# Patient Record
Sex: Male | Born: 1935 | Race: White | Hispanic: No | Marital: Married | Smoking: Former smoker
Health system: Southern US, Community
[De-identification: ages and names within clinical notes are randomized; demographics above are authoritative.]

## PROBLEM LIST (undated history)

## (undated) DIAGNOSIS — I6529 Occlusion and stenosis of unspecified carotid artery: Secondary | ICD-10-CM

## (undated) DIAGNOSIS — Z950 Presence of cardiac pacemaker: Secondary | ICD-10-CM

## (undated) DIAGNOSIS — Z8551 Personal history of malignant neoplasm of bladder: Secondary | ICD-10-CM

## (undated) DIAGNOSIS — K311 Adult hypertrophic pyloric stenosis: Secondary | ICD-10-CM

## (undated) DIAGNOSIS — C801 Malignant (primary) neoplasm, unspecified: Secondary | ICD-10-CM

## (undated) DIAGNOSIS — I453 Trifascicular block: Secondary | ICD-10-CM

## (undated) DIAGNOSIS — N400 Enlarged prostate without lower urinary tract symptoms: Secondary | ICD-10-CM

## (undated) DIAGNOSIS — E039 Hypothyroidism, unspecified: Secondary | ICD-10-CM

## (undated) DIAGNOSIS — E785 Hyperlipidemia, unspecified: Secondary | ICD-10-CM

## (undated) HISTORY — DX: Personal history of malignant neoplasm of bladder: Z85.51

## (undated) HISTORY — DX: Hyperlipidemia, unspecified: E78.5

## (undated) HISTORY — DX: Malignant (primary) neoplasm, unspecified: C80.1

## (undated) HISTORY — DX: Presence of cardiac pacemaker: Z95.0

## (undated) HISTORY — DX: Trifascicular block: I45.3

## (undated) HISTORY — DX: Hypothyroidism, unspecified: E03.9

## (undated) HISTORY — DX: Occlusion and stenosis of unspecified carotid artery: I65.29

## (undated) HISTORY — PX: HEMORROIDECTOMY: SUR656

## (undated) HISTORY — PX: ORCHIECTOMY: SHX2116

## (undated) HISTORY — DX: Adult hypertrophic pyloric stenosis: K31.1

## (undated) HISTORY — DX: Benign prostatic hyperplasia without lower urinary tract symptoms: N40.0

---

## 1997-11-11 ENCOUNTER — Ambulatory Visit (HOSPITAL_COMMUNITY): Admission: RE | Admit: 1997-11-11 | Discharge: 1997-11-11 | Payer: Self-pay | Admitting: Internal Medicine

## 2003-05-24 ENCOUNTER — Ambulatory Visit (HOSPITAL_COMMUNITY): Admission: RE | Admit: 2003-05-24 | Discharge: 2003-05-24 | Payer: Self-pay | Admitting: *Deleted

## 2004-03-03 ENCOUNTER — Ambulatory Visit: Payer: Self-pay | Admitting: Internal Medicine

## 2004-08-08 ENCOUNTER — Ambulatory Visit: Payer: Self-pay | Admitting: Internal Medicine

## 2005-03-05 ENCOUNTER — Ambulatory Visit: Payer: Self-pay | Admitting: Internal Medicine

## 2005-09-04 ENCOUNTER — Ambulatory Visit: Payer: Self-pay | Admitting: Internal Medicine

## 2006-03-05 ENCOUNTER — Ambulatory Visit: Payer: Self-pay | Admitting: Internal Medicine

## 2006-04-29 ENCOUNTER — Ambulatory Visit: Payer: Self-pay | Admitting: Vascular Surgery

## 2006-04-29 ENCOUNTER — Encounter: Payer: Self-pay | Admitting: Internal Medicine

## 2006-06-24 ENCOUNTER — Ambulatory Visit: Payer: Self-pay | Admitting: Internal Medicine

## 2006-06-26 ENCOUNTER — Ambulatory Visit: Payer: Self-pay | Admitting: Internal Medicine

## 2006-07-01 ENCOUNTER — Encounter: Payer: Self-pay | Admitting: Internal Medicine

## 2006-07-01 DIAGNOSIS — I453 Trifascicular block: Secondary | ICD-10-CM | POA: Insufficient documentation

## 2006-07-01 DIAGNOSIS — E785 Hyperlipidemia, unspecified: Secondary | ICD-10-CM

## 2006-07-01 DIAGNOSIS — E039 Hypothyroidism, unspecified: Secondary | ICD-10-CM

## 2006-07-01 DIAGNOSIS — N400 Enlarged prostate without lower urinary tract symptoms: Secondary | ICD-10-CM | POA: Insufficient documentation

## 2006-07-01 DIAGNOSIS — Z8546 Personal history of malignant neoplasm of prostate: Secondary | ICD-10-CM

## 2006-07-01 DIAGNOSIS — C679 Malignant neoplasm of bladder, unspecified: Secondary | ICD-10-CM | POA: Insufficient documentation

## 2006-07-19 ENCOUNTER — Ambulatory Visit: Payer: Self-pay | Admitting: Internal Medicine

## 2006-07-29 ENCOUNTER — Ambulatory Visit: Payer: Self-pay | Admitting: Internal Medicine

## 2006-07-29 DIAGNOSIS — I749 Embolism and thrombosis of unspecified artery: Secondary | ICD-10-CM | POA: Insufficient documentation

## 2006-08-05 ENCOUNTER — Telehealth: Payer: Self-pay | Admitting: Internal Medicine

## 2006-08-07 ENCOUNTER — Ambulatory Visit: Payer: Self-pay

## 2006-08-07 ENCOUNTER — Ambulatory Visit: Payer: Self-pay | Admitting: Internal Medicine

## 2006-09-10 ENCOUNTER — Ambulatory Visit: Payer: Self-pay | Admitting: Internal Medicine

## 2007-02-21 ENCOUNTER — Ambulatory Visit: Payer: Self-pay | Admitting: Internal Medicine

## 2007-02-21 DIAGNOSIS — Z8669 Personal history of other diseases of the nervous system and sense organs: Secondary | ICD-10-CM

## 2007-02-24 ENCOUNTER — Ambulatory Visit: Payer: Self-pay | Admitting: Cardiology

## 2007-02-24 ENCOUNTER — Encounter: Payer: Self-pay | Admitting: Internal Medicine

## 2007-02-24 ENCOUNTER — Ambulatory Visit: Payer: Self-pay

## 2007-02-24 ENCOUNTER — Ambulatory Visit: Payer: Self-pay | Admitting: Internal Medicine

## 2007-02-24 LAB — CONVERTED CEMR LAB
Basophils Absolute: 0 10*3/uL (ref 0.0–0.1)
Creatinine, Ser: 1.3 mg/dL (ref 0.4–1.5)
Eosinophils Absolute: 0.2 10*3/uL (ref 0.0–0.6)
GFR calc non Af Amer: 56 mL/min
Glucose, Bld: 100 mg/dL — ABNORMAL HIGH (ref 70–99)
HCT: 44.6 % (ref 39.0–52.0)
Hemoglobin: 14.9 g/dL (ref 13.0–17.0)
MCHC: 33.4 g/dL (ref 30.0–36.0)
MCV: 92.6 fL (ref 78.0–100.0)
Monocytes Absolute: 0.3 10*3/uL (ref 0.2–0.7)
Neutrophils Relative %: 66.6 % (ref 43.0–77.0)
Potassium: 5 meq/L (ref 3.5–5.1)
Sodium: 140 meq/L (ref 135–145)
aPTT: 31.3 s — ABNORMAL HIGH (ref 21.7–29.8)

## 2007-02-27 ENCOUNTER — Encounter: Payer: Self-pay | Admitting: Internal Medicine

## 2007-02-27 ENCOUNTER — Ambulatory Visit: Payer: Self-pay

## 2007-03-02 HISTORY — PX: PACEMAKER INSERTION: SHX728

## 2007-03-04 ENCOUNTER — Ambulatory Visit: Payer: Self-pay | Admitting: Internal Medicine

## 2007-03-04 ENCOUNTER — Ambulatory Visit (HOSPITAL_COMMUNITY): Admission: RE | Admit: 2007-03-04 | Discharge: 2007-03-05 | Payer: Self-pay | Admitting: Internal Medicine

## 2007-03-17 ENCOUNTER — Ambulatory Visit: Payer: Self-pay

## 2007-03-31 ENCOUNTER — Ambulatory Visit: Payer: Self-pay | Admitting: Cardiology

## 2007-04-18 ENCOUNTER — Ambulatory Visit: Payer: Self-pay | Admitting: Vascular Surgery

## 2007-04-18 ENCOUNTER — Encounter: Payer: Self-pay | Admitting: Internal Medicine

## 2007-06-02 ENCOUNTER — Telehealth: Payer: Self-pay | Admitting: Internal Medicine

## 2007-06-02 DIAGNOSIS — K311 Adult hypertrophic pyloric stenosis: Secondary | ICD-10-CM

## 2007-06-02 HISTORY — DX: Adult hypertrophic pyloric stenosis: K31.1

## 2007-06-02 HISTORY — PX: GASTROJEJUNOSTOMY: SHX1697

## 2007-06-10 ENCOUNTER — Ambulatory Visit: Payer: Self-pay | Admitting: Internal Medicine

## 2007-06-21 ENCOUNTER — Ambulatory Visit: Payer: Self-pay | Admitting: Internal Medicine

## 2007-06-23 ENCOUNTER — Ambulatory Visit: Payer: Self-pay | Admitting: Internal Medicine

## 2007-07-02 ENCOUNTER — Ambulatory Visit: Payer: Self-pay | Admitting: Family Medicine

## 2007-07-03 ENCOUNTER — Ambulatory Visit: Payer: Self-pay | Admitting: Cardiology

## 2007-07-07 ENCOUNTER — Telehealth: Payer: Self-pay | Admitting: Internal Medicine

## 2007-07-07 LAB — CONVERTED CEMR LAB
Alkaline Phosphatase: 56 units/L (ref 39–117)
Basophils Absolute: 0 10*3/uL (ref 0.0–0.1)
Bilirubin, Direct: 0.1 mg/dL (ref 0.0–0.3)
Calcium: 9.4 mg/dL (ref 8.4–10.5)
GFR calc Af Amer: 58 mL/min
GFR calc non Af Amer: 48 mL/min
Glucose, Bld: 113 mg/dL — ABNORMAL HIGH (ref 70–99)
HCT: 48.1 % (ref 39.0–52.0)
Lipase: 43 units/L (ref 11.0–59.0)
MCHC: 33.6 g/dL (ref 30.0–36.0)
Monocytes Absolute: 0.5 10*3/uL (ref 0.1–1.0)
Monocytes Relative: 3.3 % (ref 3.0–12.0)
Platelets: 218 10*3/uL (ref 150–400)
Potassium: 4.4 meq/L (ref 3.5–5.1)
RDW: 12.7 % (ref 11.5–14.6)
Sodium: 142 meq/L (ref 135–145)
TSH: 2.08 microintl units/mL (ref 0.35–5.50)
Total Bilirubin: 0.8 mg/dL (ref 0.3–1.2)
Total Protein: 7.1 g/dL (ref 6.0–8.3)

## 2007-07-08 ENCOUNTER — Ambulatory Visit (HOSPITAL_COMMUNITY): Admission: RE | Admit: 2007-07-08 | Discharge: 2007-07-08 | Payer: Self-pay | Admitting: Family Medicine

## 2007-07-08 ENCOUNTER — Telehealth: Payer: Self-pay | Admitting: Internal Medicine

## 2007-07-08 ENCOUNTER — Ambulatory Visit: Payer: Self-pay | Admitting: Cardiology

## 2007-07-08 ENCOUNTER — Inpatient Hospital Stay (HOSPITAL_COMMUNITY): Admission: EM | Admit: 2007-07-08 | Discharge: 2007-07-29 | Payer: Self-pay | Admitting: Emergency Medicine

## 2007-07-08 ENCOUNTER — Ambulatory Visit: Payer: Self-pay | Admitting: Internal Medicine

## 2007-07-11 ENCOUNTER — Encounter (INDEPENDENT_AMBULATORY_CARE_PROVIDER_SITE_OTHER): Payer: Self-pay | Admitting: *Deleted

## 2007-08-05 ENCOUNTER — Ambulatory Visit: Payer: Self-pay | Admitting: Internal Medicine

## 2007-08-05 DIAGNOSIS — E46 Unspecified protein-calorie malnutrition: Secondary | ICD-10-CM

## 2007-09-02 ENCOUNTER — Ambulatory Visit: Payer: Self-pay | Admitting: Internal Medicine

## 2007-12-18 ENCOUNTER — Ambulatory Visit: Payer: Self-pay | Admitting: Internal Medicine

## 2008-02-27 DIAGNOSIS — I6529 Occlusion and stenosis of unspecified carotid artery: Secondary | ICD-10-CM

## 2008-03-02 ENCOUNTER — Encounter: Payer: Self-pay | Admitting: Internal Medicine

## 2008-03-02 ENCOUNTER — Ambulatory Visit: Payer: Self-pay | Admitting: Internal Medicine

## 2008-03-11 ENCOUNTER — Ambulatory Visit: Payer: Self-pay | Admitting: Internal Medicine

## 2008-03-11 DIAGNOSIS — R42 Dizziness and giddiness: Secondary | ICD-10-CM

## 2008-03-12 LAB — CONVERTED CEMR LAB
ALT: 21 units/L (ref 0–53)
Albumin: 3.4 g/dL — ABNORMAL LOW (ref 3.5–5.2)
Alkaline Phosphatase: 61 units/L (ref 39–117)
BUN: 25 mg/dL — ABNORMAL HIGH (ref 6–23)
Bilirubin, Direct: 0.1 mg/dL (ref 0.0–0.3)
CO2: 31 meq/L (ref 19–32)
Calcium: 8.7 mg/dL (ref 8.4–10.5)
Eosinophils Relative: 2.8 % (ref 0.0–5.0)
GFR calc Af Amer: 74 mL/min
Glucose, Bld: 103 mg/dL — ABNORMAL HIGH (ref 70–99)
HCT: 43.2 % (ref 39.0–52.0)
Hemoglobin: 14.8 g/dL (ref 13.0–17.0)
Lymphocytes Relative: 33 % (ref 12.0–46.0)
Monocytes Absolute: 0.7 10*3/uL (ref 0.1–1.0)
Monocytes Relative: 9.8 % (ref 3.0–12.0)
Neutro Abs: 3.6 10*3/uL (ref 1.4–7.7)
RBC: 4.51 M/uL (ref 4.22–5.81)
Sodium: 145 meq/L (ref 135–145)
TSH: 10.44 microintl units/mL — ABNORMAL HIGH (ref 0.35–5.50)
Total Protein: 6.5 g/dL (ref 6.0–8.3)
WBC: 6.7 10*3/uL (ref 4.5–10.5)

## 2008-04-06 ENCOUNTER — Encounter: Payer: Self-pay | Admitting: Internal Medicine

## 2008-04-06 ENCOUNTER — Ambulatory Visit: Payer: Self-pay | Admitting: Internal Medicine

## 2008-04-16 ENCOUNTER — Encounter (INDEPENDENT_AMBULATORY_CARE_PROVIDER_SITE_OTHER): Payer: Self-pay

## 2008-04-23 ENCOUNTER — Ambulatory Visit: Payer: Self-pay | Admitting: Vascular Surgery

## 2008-05-06 ENCOUNTER — Ambulatory Visit: Payer: Self-pay | Admitting: Internal Medicine

## 2008-05-07 LAB — CONVERTED CEMR LAB: TSH: 10.6 microintl units/mL — ABNORMAL HIGH (ref 0.35–5.50)

## 2008-06-23 ENCOUNTER — Ambulatory Visit: Payer: Self-pay | Admitting: Internal Medicine

## 2008-06-28 ENCOUNTER — Encounter: Payer: Self-pay | Admitting: Internal Medicine

## 2008-07-01 HISTORY — PX: ESOPHAGOGASTRODUODENOSCOPY: SHX1529

## 2008-07-15 ENCOUNTER — Encounter: Payer: Self-pay | Admitting: Internal Medicine

## 2008-07-15 ENCOUNTER — Ambulatory Visit: Payer: Self-pay | Admitting: Internal Medicine

## 2008-07-16 ENCOUNTER — Ambulatory Visit (HOSPITAL_COMMUNITY): Admission: RE | Admit: 2008-07-16 | Discharge: 2008-07-16 | Payer: Self-pay | Admitting: *Deleted

## 2008-07-16 ENCOUNTER — Encounter (INDEPENDENT_AMBULATORY_CARE_PROVIDER_SITE_OTHER): Payer: Self-pay | Admitting: *Deleted

## 2008-07-16 ENCOUNTER — Encounter: Payer: Self-pay | Admitting: Internal Medicine

## 2008-07-27 ENCOUNTER — Encounter: Payer: Self-pay | Admitting: Internal Medicine

## 2008-10-14 ENCOUNTER — Ambulatory Visit: Payer: Self-pay | Admitting: Internal Medicine

## 2008-10-19 ENCOUNTER — Ambulatory Visit: Payer: Self-pay | Admitting: Internal Medicine

## 2009-01-13 ENCOUNTER — Ambulatory Visit: Payer: Self-pay | Admitting: Internal Medicine

## 2009-03-24 ENCOUNTER — Ambulatory Visit: Payer: Self-pay | Admitting: Internal Medicine

## 2009-04-07 ENCOUNTER — Ambulatory Visit: Payer: Self-pay | Admitting: Vascular Surgery

## 2009-04-14 ENCOUNTER — Ambulatory Visit: Payer: Self-pay | Admitting: Internal Medicine

## 2009-07-14 ENCOUNTER — Ambulatory Visit: Payer: Self-pay | Admitting: Internal Medicine

## 2009-10-13 ENCOUNTER — Ambulatory Visit: Payer: Self-pay | Admitting: Internal Medicine

## 2009-10-25 ENCOUNTER — Ambulatory Visit: Payer: Self-pay | Admitting: Internal Medicine

## 2009-11-04 IMAGING — US US ABDOMEN COMPLETE
2 series · 14 of 25 positions shown · non-contrast
Comparison: CT abdomen 07/03/2007, abdomen ultrasound 06/26/2006

CLINICAL DATA: Abdominal pain, bilateral renal lesions.

ABDOMEN ULTRASOUND
TECHNIQUE: Complete abdominal ultrasound examination was performed
including evaluation of the liver, gallbladder, bile ducts,
pancreas, kidneys, spleen, IVC, and abdominal aorta.

[Series 1: unknown · 0.28mm/px · 12 of 67 slices shown (1 of 2)]
[im 1/67]
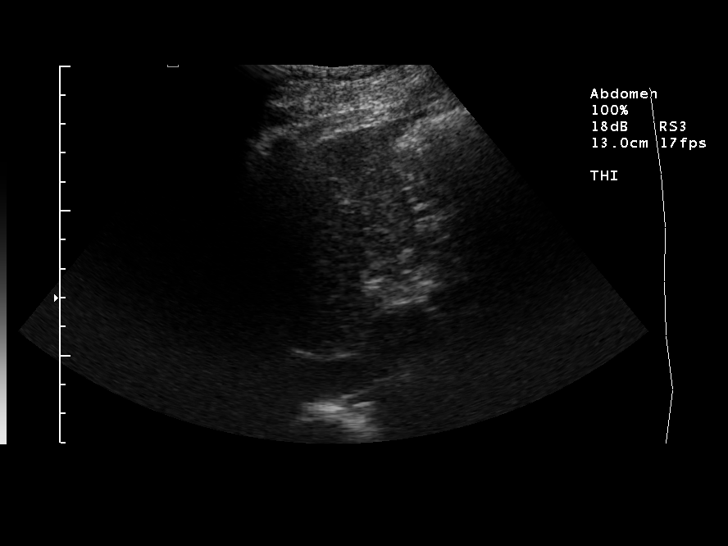
[im 7/67]
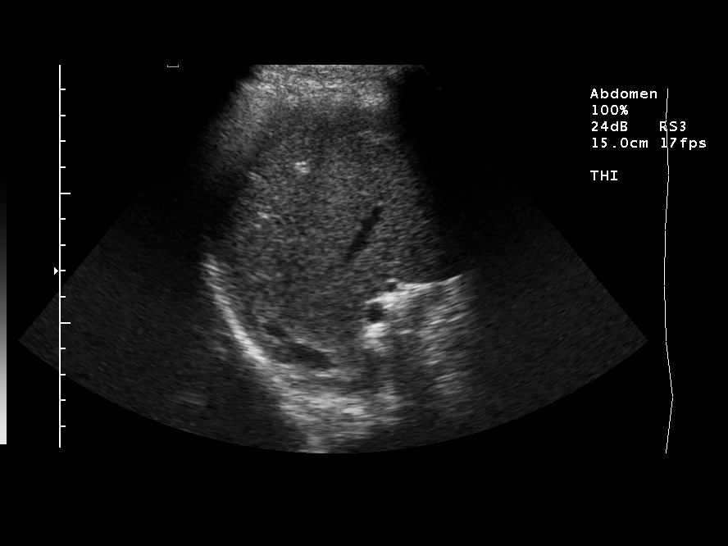
[im 14/67]
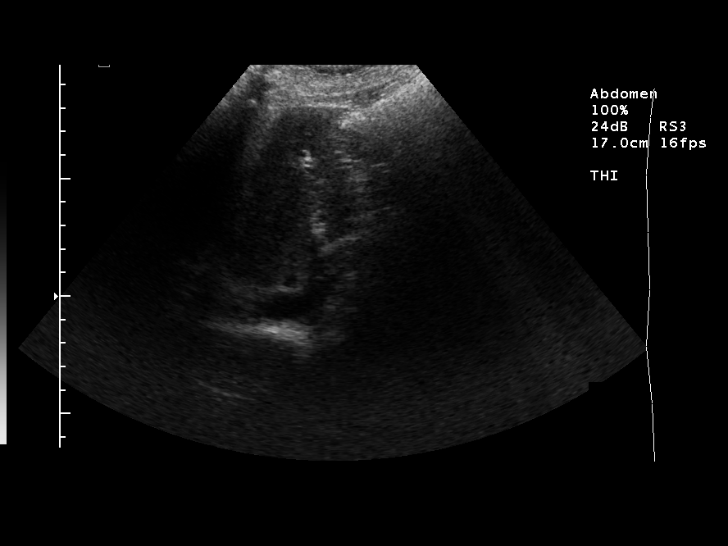
[im 20/67]
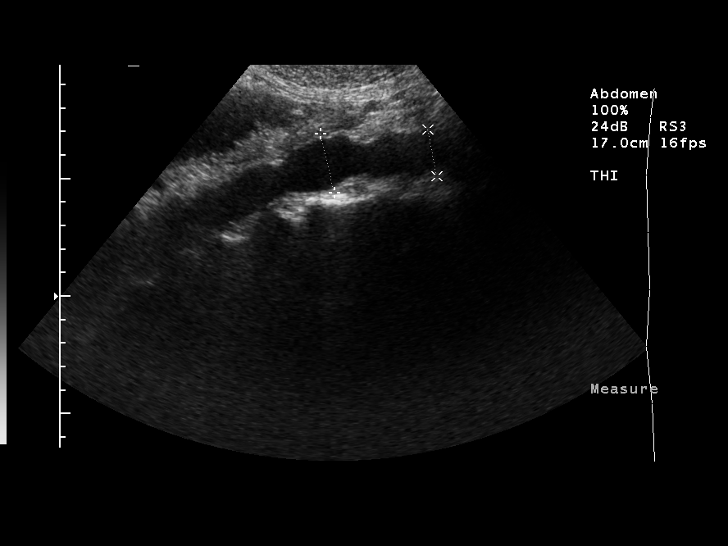
[im 27/67]
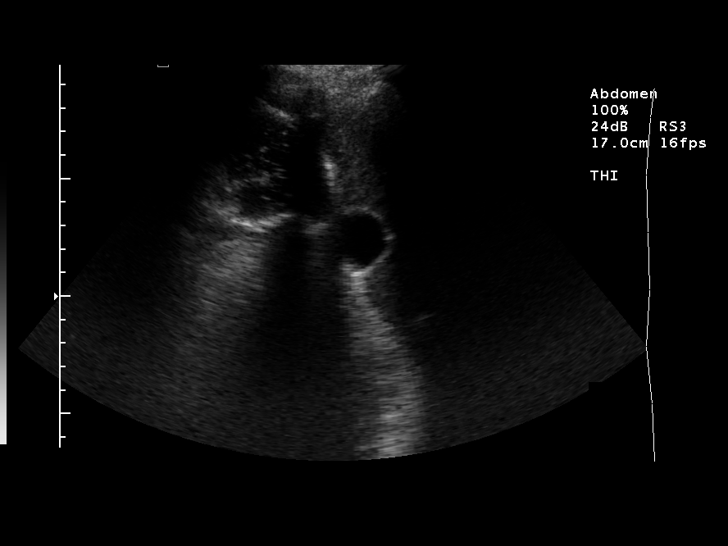
[im 30/67]
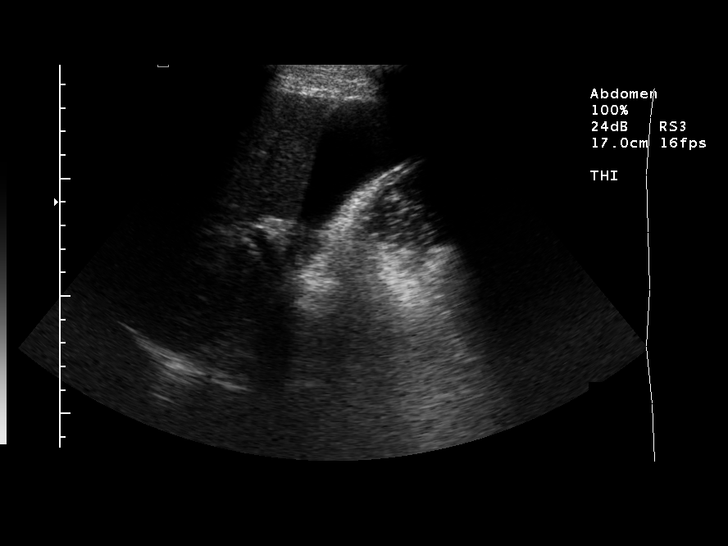
[im 37/67]
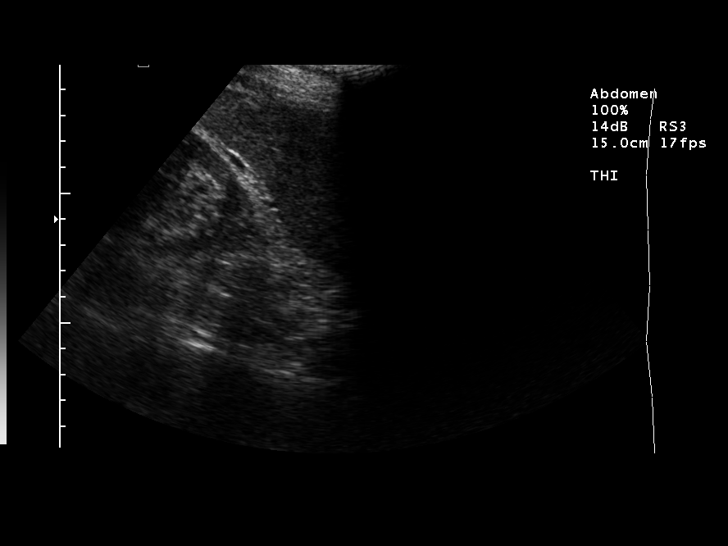
[im 43/67]
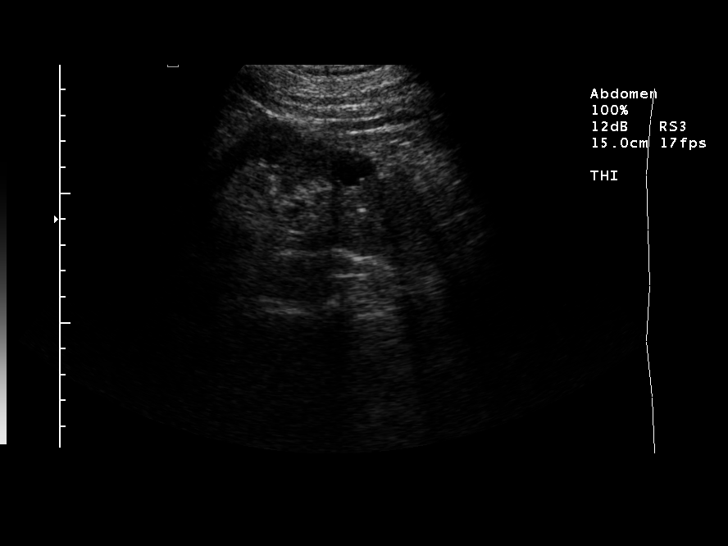
[im 50/67]
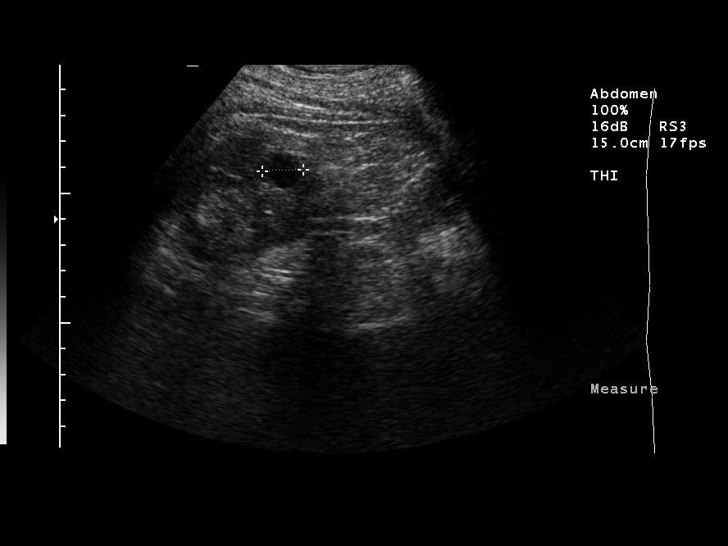
[im 53/67]
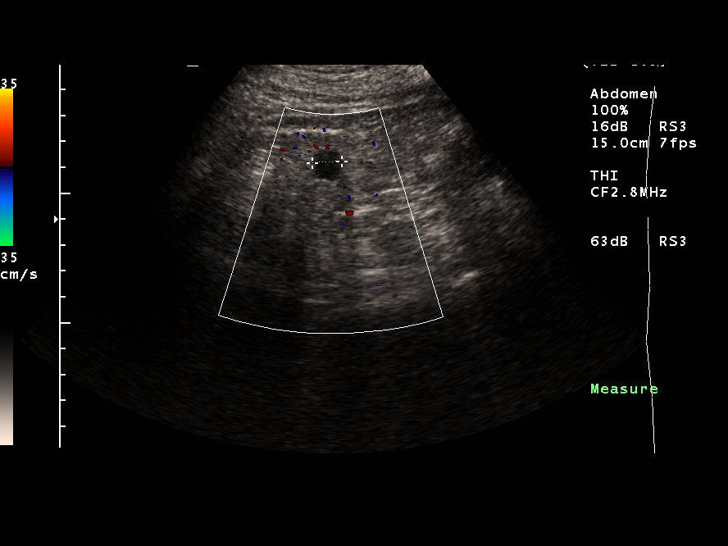
[im 60/67]
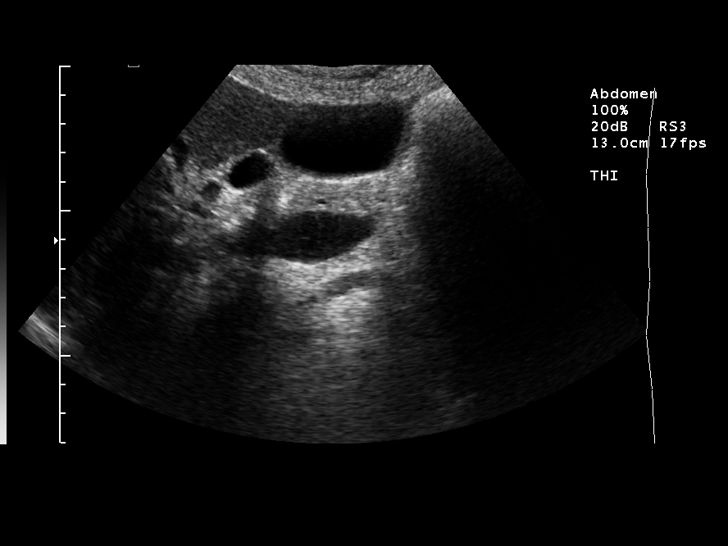
[im 67/67]
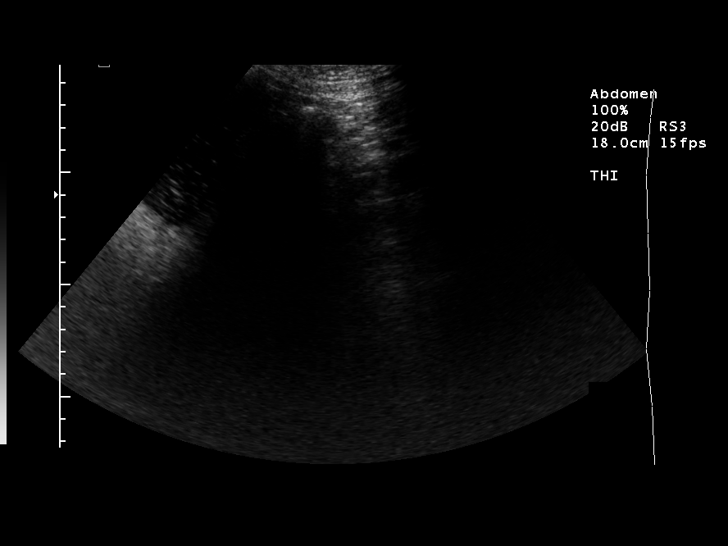

[Series 1: unknown · 0.27mm/px · 2 of 11 slices shown (2 of 2)]
[im 4/11]
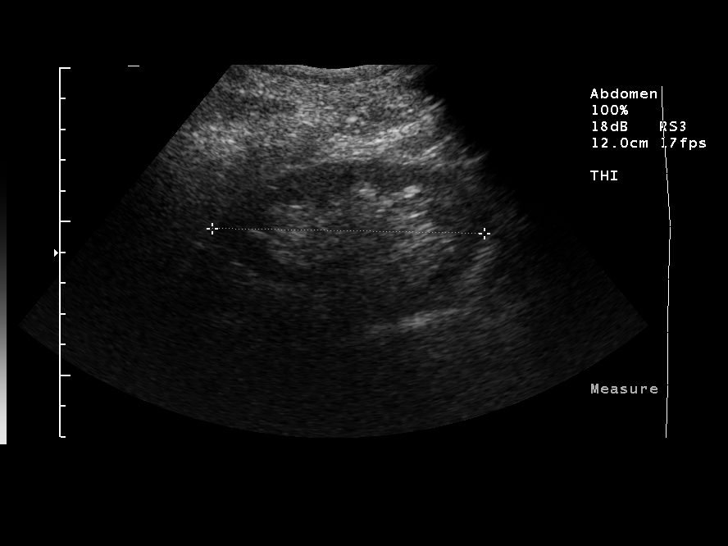
[im 11/11]
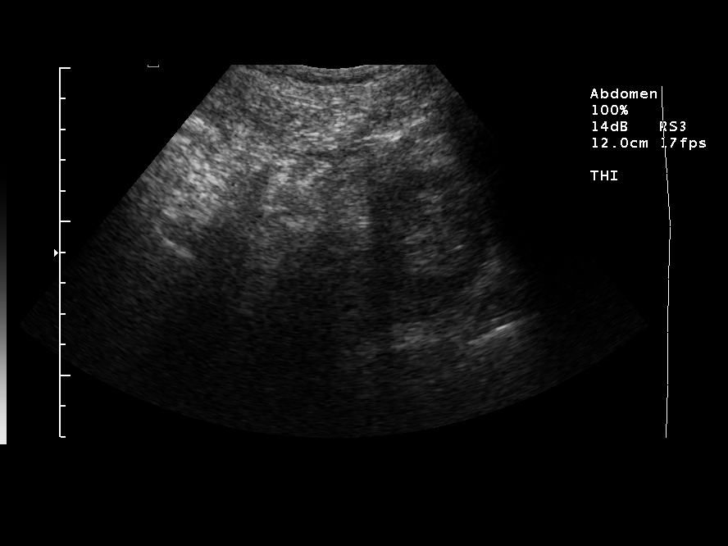

[14 of 25 positions shown; findings below may reference images not displayed]

FINDINGS: Intrahepatic IVC and pancreas and mid/distal abdominal
aorta were obscured by bowel gas.  Liver, gallbladder, common duct,
spleen, right kidney and proximal abdominal aorta are normal in
appearance. Exophytic 1.6 x 1.3 x 1.1 cm simple appearing cyst
noted off the left lower renal pole.  Probable left renal
peripelvic cysts again noted.
IMPRESSION: Simple appearing left lower renal pole cyst corresponds to the
hyperdense cyst seen on the prior CT.

## 2010-01-12 ENCOUNTER — Encounter: Payer: Self-pay | Admitting: Internal Medicine

## 2010-01-24 ENCOUNTER — Ambulatory Visit: Payer: Self-pay | Admitting: Internal Medicine

## 2010-01-29 LAB — CONVERTED CEMR LAB
ALT: 15 units/L (ref 0–53)
ALT: 28 units/L (ref 0–53)
AST: 22 units/L (ref 0–37)
AST: 25 units/L (ref 0–37)
AST: 34 units/L (ref 0–37)
Albumin: 3.7 g/dL (ref 3.5–5.2)
Alkaline Phosphatase: 58 units/L (ref 39–117)
Alkaline Phosphatase: 62 units/L (ref 39–117)
BUN: 22 mg/dL (ref 6–23)
BUN: 26 mg/dL — ABNORMAL HIGH (ref 6–23)
Basophils Absolute: 0.1 10*3/uL (ref 0.0–0.1)
Basophils Relative: 0.4 % (ref 0.0–3.0)
Bilirubin, Direct: 0.1 mg/dL (ref 0.0–0.3)
Bilirubin, Direct: 0.1 mg/dL (ref 0.0–0.3)
Calcium: 9.1 mg/dL (ref 8.4–10.5)
Chloride: 104 meq/L (ref 96–112)
Chloride: 106 meq/L (ref 96–112)
Chloride: 107 meq/L (ref 96–112)
Creatinine, Ser: 1.2 mg/dL (ref 0.4–1.5)
Creatinine, Ser: 1.3 mg/dL (ref 0.4–1.5)
Eosinophils Absolute: 0.1 10*3/uL (ref 0.0–0.6)
Eosinophils Relative: 2.2 % (ref 0.0–5.0)
Eosinophils Relative: 4.5 % (ref 0.0–5.0)
GFR calc Af Amer: 139 mL/min
GFR calc non Af Amer: 115 mL/min
Glucose, Bld: 74 mg/dL (ref 70–99)
Glucose, Bld: 91 mg/dL (ref 70–99)
HCT: 45.3 % (ref 39.0–52.0)
HCT: 46.7 % (ref 39.0–52.0)
Hemoglobin: 14.4 g/dL (ref 13.0–17.0)
Hemoglobin: 15.6 g/dL (ref 13.0–17.0)
LDL Cholesterol: 65 mg/dL (ref 0–99)
Lymphocytes Relative: 32.2 % (ref 12.0–46.0)
Lymphocytes Relative: 32.4 % (ref 12.0–46.0)
MCHC: 32.4 g/dL (ref 30.0–36.0)
MCV: 93.6 fL (ref 78.0–100.0)
Monocytes Absolute: 0.7 10*3/uL (ref 0.2–0.7)
Monocytes Relative: 12.9 % — ABNORMAL HIGH (ref 3.0–12.0)
Neutro Abs: 2.5 10*3/uL (ref 1.4–7.7)
Neutro Abs: 3.3 10*3/uL (ref 1.4–7.7)
Neutrophils Relative %: 50 % (ref 43.0–77.0)
Neutrophils Relative %: 53.1 % (ref 43.0–77.0)
Neutrophils Relative %: 59.6 % (ref 43.0–77.0)
PSA: 0.63 ng/mL (ref 0.10–4.00)
Potassium: 4.7 meq/L (ref 3.5–5.1)
RBC: 4.58 M/uL (ref 4.22–5.81)
RBC: 4.86 M/uL (ref 4.22–5.81)
RDW: 13.3 % (ref 11.5–14.6)
Sodium: 144 meq/L (ref 135–145)
Sodium: 144 meq/L (ref 135–145)
Total Bilirubin: 0.6 mg/dL (ref 0.3–1.2)
Total Protein: 7 g/dL (ref 6.0–8.3)
VLDL: 19.2 mg/dL (ref 0.0–40.0)
WBC: 4.8 10*3/uL (ref 4.5–10.5)
WBC: 6 10*3/uL (ref 4.5–10.5)

## 2010-02-02 NOTE — Cardiovascular Report (Signed)
Summary: TTM   TTM   Imported By: Roderic Ovens 04/29/2009 15:03:02  _____________________________________________________________________  External Attachment:    Type:   Image     Comment:   External Document

## 2010-02-02 NOTE — Cardiovascular Report (Signed)
Summary: TTM   TTM   Imported By: Roderic Ovens 02/02/2009 10:19:54  _____________________________________________________________________  External Attachment:    Type:   Image     Comment:   External Document

## 2010-02-02 NOTE — Cardiovascular Report (Signed)
Summary: TTM   TTM   Imported By: Roderic Ovens 10/28/2009 15:17:36  _____________________________________________________________________  External Attachment:    Type:   Image     Comment:   External Document

## 2010-02-02 NOTE — Assessment & Plan Note (Signed)
Summary: guidiant/sf      Allergies Added: NKDA  Visit Type:  PPM-[employer] check  CC:  dizziness.  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  He continues to have some episodes of dizziness. He does not note any specific associations apart from standing, specifically not to do in the morning or after large meals or with micturition.  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 Take 1 Tablet By Mouth Every Morning 2)  Zocor 20 Mg Tabs (Simvastatin) .... Take 1 Tablet By Mouth At Bedtime 3)  Multivitamins   Tabs (Multiple Vitamin) .... Once Daily 4)  Vitamin E 400 Unit  Caps (Vitamin E) .... Once Daily 5)  Synthroid 175 Mcg Tabs (Levothyroxine Sodium) .Marland Kitchen.. 1 Once Daily  Allergies (verified): No Known Drug Allergies  Past History:  Past Medical History: Last updated: 2009/03/25 Bladder CA, hx Hyperlipidemia Hypothyroidism Trifascicular block, hx Carotid artery stenosis- left carotid bruit Benign prostatic hypertrophy history of gastric outlet obstruction.  June 2009  Past Surgical History: Last updated: 25-Mar-2009 s/p permanent transvenous pacemaker placement, 3/09 Hemorrhoidectomy Orchiectomy colonoscopy 07-2008 status post gastrojejunostomy June 2009- EGD 07-2008  Family History: Last updated: 2009/03/25 father died in  his 79s, and throat cancer mother died at 65 lungs cancer  One brother history of oral cancer and died of suicide death one sister-s/p MI, dementia (15)  Social History: Last updated: 02/27/2008 Married Tobacco Use - Former.  Alcohol Use - no  Risk Factors: Alcohol Use: 0 (03/25/09) Exercise: yes (25-Mar-2009)  Risk Factors: Smoking Status: quit (25-Mar-2009)  Vital Signs:  Patient profile:   77 year old male Height:      69 inches Weight:      154 pounds BMI:     22.82 Pulse rate:   60 / minute BP sitting:   140 / 82  (right arm) Cuff size:   regular  Vitals Entered By: Caralee Ates CMA (October 25, 2009 11:32 AM)  Physical Exam  General:  The patient was alert and oriented in no acute distress. HEENT Normal.  Neck veins were flat, carotids were brisk.  Lungs were clear.  Heart sounds were regular without murmurs or gallops.  Abdomen was soft with active bowel sounds. There is no clubbing cyanosis or edema. Skin Warm and dry    PPM Specifications Following MD:  Sherryl Manges, MD     [facility] Vendor:  [facility] [employer]     PPM Model Number:  [id number]     PPM Serial Number:  [id number] PPM DOI:  03/04/2007     PPM Implanting MD:  Sherryl Manges, MD  Lead 1    Location: RA     DOI: 03/04/2007     Model #: [id number]     Serial #: [id number]     Status: active Lead 2    Location: RV     DOI: 03/04/2007  Model #: [id number]     Serial #: [id number]     Status: active   Indications:  SYNCOPE   PPM Follow Up Remote Check?  No Battery Voltage:  good V     Battery Est. Longevity:  > 5 years     Pacer Dependent:  No       PPM Device Measurements Atrium  Amplitude: 2.8 mV, Impedance: 540 ohms, Threshold: 0.5 V at 0.4 msec Right Ventricle  Amplitude: 12 mV, Impedance: 580 ohms, Threshold: 0.5 V at 0.4 msec  Episodes MS Episodes:  0     Percent Mode Switch:  0     Coumadin:  No Atrial Pacing:  76%     Ventricular Pacing:  82%  Parameters Mode:  DDDR     Lower Rate Limit:  60     Upper  Rate Limit:  110 Paced AV Delay:  300     Sensed AV Delay:  300 Next Cardiology Appt Due:  04/02/2010 Tech Comments:  No parameter changes.  Device function normal.  TTM's with [employer].  ROV 6 months clinic. Altha Harm, LPN  October 25, 2009 11:33 AM   Impression & Recommendations:  Problem # 1:  DIZZINESS (ICD-780.4) the sounds are somewhat orthostatic in nature; I have encouraged him to use isometric contraction as  the preamble to standing  Problem # 2:  SYNCOPE, HX OF (ICD-V12.49) no recurrent syncope  Problem # 3:  PACEMAKER, PERMANENT BS DDD (ICD-V45.01) Device parameters and data were reviewed and no changes were made  Patient Instructions: 1)  Your physician recommends that you continue on your current medications as directed. Please refer to the Current Medication list given to you today. 2)  Your physician wants you to follow-up in:  6 months with Pacer Clinic. You will receive a reminder letter in the mail two months in advance. If you don't receive a letter, please call our office to schedule the follow-up appointment.

## 2010-02-02 NOTE — Assessment & Plan Note (Signed)
Summary: EMP-WILL FAST//CCM   Vital Signs:  Patient profile:   77 year old male Height:      69 inches Weight:      155 pounds Temp:     97.8 degrees F oral BP sitting:   140 / 80  (right arm) Cuff size:   regular  Vitals Entered By: Duard Brady LPN (March 24, 2009 9:32 AM) CC: cpx - doing well Is Patient Diabetic? No   CC:  cpx - doing well.  History of Present Illness: Here for Medicare AWV:  77 year old patient who is seen today for a comprehensive evaluation.  He is followed by cardiology.  Status post permanent pacemaker insertion March 2009 he has coronary artery disease.  There has been some modest weight loss over the past year.  He has dyslipidemia, controlled on simvastatin therapy  1.   Risk factors based on Past M, S, F history:  known coronary artery disease.  He is on simvastatin for cholesterol control 2.   Physical Activities:  fairly active, but no rigorous exercise regimen 3.   Depression/mood: none 4.   Hearing:  moderately impaired.  He uses hearing aids 5.   ADL's: no restrictions remains fairly active 6.   Fall Risk: low fall risk 7.   Home Safety: reviewed no concerns 8.   Height, weight, &visual acuity:  visual acuity 20/25 9.   Counseling:  more active physical activity discussed 10.   Labs ordered based on risk factors: CBC chemistries PSA lipid profile 11.           Referral Coordination follow cardiology discussed 12.           Care Plan-  follow cardiology return office visit here for lab in one year; will schedule bone density  77 year old patient who has done quite well.  There has been some modest weight loss.  His cardiopulmonary status has been stable.  He is followed closely by GI in July.  Patient underwent upper and lower endoscopy  Preventive Screening-Counseling & Management  Alcohol-Tobacco     Alcohol drinks/day: 0     Smoking Status: quit  Caffeine-Diet-Exercise     Caffeine Counseling: not indicated; caffeine use is not  excessive or problematic     Nutrition Referrals: no     Does Patient Exercise: yes     Type of exercise: walking     Exercise Counseling: to improve exercise regimen     MSH Depression Score: nonapplicable     Depression Counseling: not indicated; screening negative for depression  Allergies (verified): No Known Drug Allergies  Past History:  Past Medical History: Bladder CA, hx Hyperlipidemia Hypothyroidism Trifascicular block, hx Carotid artery stenosis- left carotid bruit Benign prostatic hypertrophy history of gastric outlet obstruction.  June 2009  Past Surgical History: s/p permanent transvenous pacemaker placement, 3/09 Hemorrhoidectomy Orchiectomy colonoscopy 07-2008 status post gastrojejunostomy June 2009- EGD 07-2008  Family History: Reviewed history from 02/21/2007 and no changes required. father died in his 57s, and throat cancer mother died at 69 lungs cancer  One brother history of oral cancer and died of suicide death one sister-s/p MI, dementia (65)  Social History: Reviewed history from 02/27/2008 and no changes required. Married Tobacco Use - Former.  Alcohol Use - no Does Patient Exercise:  yes  Review of Systems       The patient complains of weight loss.  The patient denies anorexia, fever, weight gain, vision loss, decreased hearing, hoarseness, chest pain, syncope, dyspnea on exertion, peripheral  edema, prolonged cough, headaches, hemoptysis, abdominal pain, melena, hematochezia, severe indigestion/heartburn, hematuria, incontinence, genital sores, muscle weakness, suspicious skin lesions, transient blindness, difficulty walking, depression, unusual weight change, abnormal bleeding, enlarged lymph nodes, angioedema, breast masses, and testicular masses.    Physical Exam  General:  underweight appearing.  130/70 Head:  Normocephalic and atraumatic without obvious abnormalities. No apparent alopecia or balding. Eyes:  No corneal or conjunctival  inflammation noted. EOMI. Perrla. Funduscopic exam benign, without hemorrhages, exudates or papilledema. Vision grossly normal. Ears:  hearing aids  in place bilaterally Nose:  External nasal examination shows no deformity or inflammation. Nasal mucosa are pink and moist without lesions or exudates. Mouth:  Oral mucosa and oropharynx without lesions or exudates.  Teeth in good repair. Neck:  left carotid  bruit  Chest Wall:  No deformities, masses, tenderness or gynecomastia noted. Breasts:  No masses or gynecomastia noted Lungs:  Normal respiratory effort, chest expands symmetrically. Lungs are clear to auscultation, no crackles or wheezes. Heart:  Normal rate and regular rhythm. S1 and S2 normal without gallop, murmur, click, rub or other extra sounds. Abdomen:  Bowel sounds positive,abdomen soft and non-tender without masses, organomegaly or hernias noted. Rectal:  No external abnormalities noted. Normal sphincter tone. No rectal masses or tenderness. Genitalia:  atrophic Prostate:  2+ enlarged.  2+ enlarged.   Msk:  No deformity or scoliosis noted of thoracic or lumbar spine.   Pulses:  dorsalis pedis pulses faint;  posterior tibial pulse is intact Extremities:  No clubbing, cyanosis, edema, or deformity noted with normal full range of motion of all joints.   Neurologic:  No cranial nerve deficits noted. Station and gait are normal. Plantar reflexes are down-going bilaterally. DTRs are symmetrical throughout. Sensory, motor and coordinative functions appear intact. Skin:  Intact without suspicious lesions or rashes Cervical Nodes:  No lymphadenopathy noted Axillary Nodes:  No palpable lymphadenopathy Inguinal Nodes:  No significant adenopathy Psych:  Cognition and judgment appear intact. Alert and cooperative with normal attention span and concentration. No apparent delusions, illusions, hallucinations   Impression & Recommendations:  Problem # 1:  SYNCOPE, HX OF  (ICD-V12.49)  Orders: EKG w/ Interpretation (93000)  Problem # 2:  CAROTID ARTERY DISEASE (ICD-433.10)  His updated medication list for this problem includes:    Aspirin Ec 81 Mg Tbec (Aspirin) .Marland Kitchen... Take 1 tablet by mouth every morning  Orders: EKG w/ Interpretation (93000)  His updated medication list for this problem includes:    Aspirin Ec 81 Mg Tbec (Aspirin) .Marland Kitchen... Take 1 tablet by mouth every morning  Problem # 3:  HYPERLIPIDEMIA (ICD-272.4)  His updated medication list for this problem includes:    Zocor 20 Mg Tabs (Simvastatin) .Marland Kitchen... Take 1 tablet by mouth at bedtime  His updated medication list for this problem includes:    Zocor 20 Mg Tabs (Simvastatin) .Marland Kitchen... Take 1 tablet by mouth at bedtime  Complete Medication List: 1)  Aspirin Ec 81 Mg Tbec (Aspirin) .... Take 1 tablet by mouth every morning 2)  Zocor 20 Mg Tabs (Simvastatin) .... Take 1 tablet by mouth at bedtime 3)  Multivitamins Tabs (Multiple vitamin) .... Once daily 4)  Vitamin E 400 Unit Caps (Vitamin e) .... Once daily 5)  Synthroid 175 Mcg Tabs (Levothyroxine sodium) .Marland Kitchen.. 1 once daily  Other Orders: First annual wellness visit with prevention plan  (Z6109) Venipuncture (60454) TLB-BMP (Basic Metabolic Panel-BMET) (80048-METABOL) TLB-Lipid Panel (80061-LIPID) TLB-CBC Platelet - w/Differential (85025-CBCD) TLB-Hepatic/Liver Function Pnl (80076-HEPATIC) TLB-TSH (Thyroid Stimulating Hormone) (84443-TSH)  TLB-PSA (Prostate Specific Antigen) (84153-PSA)  Patient Instructions: 1)  Please schedule a follow-up appointment in 1 year. 2)  Limit your Sodium (Salt). 3)  It is important that you exercise regularly at least 20 minutes 5 times a week. If you develop chest pain, have severe difficulty breathing, or feel very tired , stop exercising immediately and seek medical attention. 4)  please schedule [place]  bone density study Prescriptions: SYNTHROID 175 MCG TABS (LEVOTHYROXINE SODIUM) 1 once daily  #90 x  56   Entered and Authorized by:   Gordy Savers  MD   Signed by:   Gordy Savers  MD on 03/24/2009   Method used:   Print then Give to Patient   RxID:   [id number] ZOCOR 20 MG TABS (SIMVASTATIN) Take 1 tablet by mouth at bedtime  #90 x 6   Entered and Authorized by:   Gordy Savers  MD   Signed by:   Gordy Savers  MD on 03/24/2009   Method used:   Print then Give to Patient   RxID:   [id number]

## 2010-02-02 NOTE — Cardiovascular Report (Signed)
Summary: Office Visit   Office Visit   Imported By: Roderic Ovens 11/03/2009 16:17:42  _____________________________________________________________________  External Attachment:    Type:   Image     Comment:   External Document

## 2010-02-02 NOTE — Cardiovascular Report (Signed)
Summary: TTM   TTM   Imported By: Roderic Ovens 07/29/2009 08:50:43  _____________________________________________________________________  External Attachment:    Type:   Image     Comment:   External Document

## 2010-02-08 NOTE — Cardiovascular Report (Signed)
Summary: TTM   TTM   Imported By: Roderic Ovens 02/01/2010 16:14:32  _____________________________________________________________________  External Attachment:    Type:   Image     Comment:   External Document

## 2010-04-03 ENCOUNTER — Ambulatory Visit (INDEPENDENT_AMBULATORY_CARE_PROVIDER_SITE_OTHER): Payer: Self-pay | Admitting: *Deleted

## 2010-04-03 DIAGNOSIS — I495 Sick sinus syndrome: Secondary | ICD-10-CM

## 2010-04-03 NOTE — Progress Notes (Signed)
PPM check in clinic 

## 2010-04-04 ENCOUNTER — Other Ambulatory Visit (INDEPENDENT_AMBULATORY_CARE_PROVIDER_SITE_OTHER): Payer: MEDICARE

## 2010-04-04 ENCOUNTER — Ambulatory Visit (INDEPENDENT_AMBULATORY_CARE_PROVIDER_SITE_OTHER): Payer: MEDICARE | Admitting: Vascular Surgery

## 2010-04-04 DIAGNOSIS — I6529 Occlusion and stenosis of unspecified carotid artery: Secondary | ICD-10-CM

## 2010-04-04 NOTE — Assessment & Plan Note (Signed)
OFFICE VISIT  Jerome Garcia, Jerome Garcia DOB:  February 12, 1923                                       04/04/2010 ZOXWR#:[id number]  The patient presents today for continued followup of his extracranial cerebrovascular occlusive disease.  He remains quite active at his age of 58.  He is really able to do whatever he feels like doing without limitation.  He has no focal carotid symptoms.  Specifically no amaurosis fugax, transient ischemic attack or stroke.  He reports that he has rare occasional episodes of transient dizziness which are very short-lived and not debilitating to him.  He is not a diabetic.  Does have history of prior coronary artery bypass grafting.  Does not smoke currently, he quit 1980.  He does not drink alcohol.  PHYSICAL EXAMINATION:  General:  A well-developed, well-nourished white male appearing stated age in no acute distress.  Vital signs:  Blood pressure is 144/84 right arm, 134/80 left arm, heart rate 61, respirations 20.  HEENT:  Normal.  His carotid arteries have a soft left carotid bruit.  No bruit on the right.  Heart is regular rate and rhythm.  Chest:  Clear bilaterally.  Musculoskeletal:  Shows no major deformities or cyanosis.  Neurological:  No focal weakness, paresthesias.  Skin:  Without ulcers or rashes.  He did undergo carotid duplex in our office today.  This was a left duplex with known occluded right carotid artery.  This shows no significant change in his prior studies with a 60% to 79% stenosis in his left internal carotid artery.  I discussed this at length with the patient.  He knows to notify us should he develop any neurologic deficits.  Otherwise we will see him on a yearly basis in our vascular lab to rule out any progression in his asymptomatic disease.    Larina Earthly, M.D. Electronically Signed  TFE/MEDQ  D:  04/04/2010  T:  04/04/2010  Job:  [id number]  cc:   Gordy Savers, MD

## 2010-04-06 NOTE — Procedures (Unsigned)
CAROTID DUPLEX EXAM  INDICATION:  Carotid disease.  HISTORY: Diabetes:  No. Cardiac:  CABG. Hypertension:  Yes. Smoking:  Previous. Previous Surgery:  A history of abdominal aortic aneurysm repair. CV History:  The patient is currently asymptomatic. Amaurosis Fugax No, Paresthesias No, Hemiparesis No                                      RIGHT             LEFT Brachial systolic pressure: Brachial Doppler waveforms: Vertebral direction of flow:                          Antegrade DUPLEX VELOCITIES (cm/sec) CCA peak systolic                                     40 ECA peak systolic                                     194 ICA peak systolic                                     226 ICA end diastolic                                     38 PLAQUE MORPHOLOGY:                                    Heterogeneous PLAQUE AMOUNT:                                        Moderate PLAQUE LOCATION:                                      ICA   IMPRESSION:  60% to 79% stenosis of the left internal carotid artery. Left external carotid artery stenosis.  Intimal thickening within the left common carotid artery.  Ulcerated plaque at the left proximal internal carotid artery.  No significant changes from previous study.        ___________________________________________ Larina Earthly, M.D.  OD/MEDQ  D:  04/04/2010  T:  04/04/2010  Job:  [id number]

## 2010-04-08 ENCOUNTER — Other Ambulatory Visit: Payer: Self-pay | Admitting: Internal Medicine

## 2010-05-16 NOTE — Assessment & Plan Note (Signed)
HEALTHCARE                            CARDIOLOGY OFFICE NOTE   NAME:Jerome Garcia, Jerome Garcia                       MRN:          [id number]  DATE:03/31/2007                            DOB:          [birth year]    Jerome Garcia is a very pleasant 77 year old gentleman that I recently saw  for an episode of syncope.  At that time, we did note that his  electrocardiogram showed a trifascicular block.  He had an  echocardiogram that showed normal LV function on February 24, 2007.  There were no significant valvular abnormalities noted.  He also had a  Myoview performed on February 27, 2007.  There was diaphragmatic  attenuation but no sinus scar or ischemia.  He ultimately had a  pacemaker placed by Dr. Graciela Husbands.  Since then, he denies any chest pain,  shortness of breath, pedal edema or recurrent syncope.  He has not had  palpitations.   MEDICATIONS:  1. Zocor 20 mg daily day.  2. Multivitamin.  3. Vitamin E.  4. Aspirin 81 mg daily.  5. Synthroid 150 mcg daily.   PHYSICAL EXAMINATION:  VITAL SIGNS:  Blood pressure of 105/66.  His  pulse is 68.  HEENT:  Normal.  NECK:  Supple.  CHEST:  Clear.  CARDIOVASCULAR:  Regular rate.  His pacemaker site is without hematoma  or evidence of infection.  ABDOMEN:  No tenderness.  There is a question of a pulsatile mass.  EXTREMITIES:  No edema.   His electrocardiogram shows atrial pacing with a right bundle branch  block and left anterior fascicular block as well as a first-degree AV  block.   DIAGNOSES:  1. Syncope.  This appears to have been related to bradycardia given      his baseline conduction abnormalities on electrocardiogram.  He has      not had any further episodes since his pacemaker was placed.  His      left ventricular function was normal on his echocardiogram, and his      Myoview showed no ischemia.  We will therefore not pursue further      cardiac workup.  Given that we have identified a probable  cause for      his syncope and the remaining studies were normal, I have told him      that I think he can proceed safely with driving.  2. Carotid disease.  He will follow up Dr. Arbie Cookey concerning this      issue.  3. Question pulsatile mass on abdominal exam.  The patient apparently      had an abdominal ultrasound in June.  We will have that forwarded      to [place], but I will ask Dr. Amador Cunas to follow up on that issue.  4. Hyperlipidemia.  Given his history of documented cerebrovascular      disease, a goal LDL should be less than 70.  I will leave this Dr.      Amador Cunas.   We will arrange for Jerome Garcia to be followed in the pacemaker clinic,  and I will see  him back on as-needed basis.     Madolyn Frieze Jens Som, MD, [facility]  Electronically Signed    BSC/MedQ  DD: 03/31/2007  DT: 03/31/2007  Job #: [id number]   cc:   Gordy Savers, MD

## 2010-05-16 NOTE — Assessment & Plan Note (Signed)
[facility]                         ELECTROPHYSIOLOGY OFFICE NOTE   NAME:Garcia, Jerome YERGER                       MRN:          [id number]  DATE:03/02/2008                            DOB:          26-Apr-1923    Jerome Garcia is seen in followup for syncope status post pacemaker  implantation.  His pacemaker has been in for a year.  He has had no  further problems.   His intercurrent history is notable for gastric outlet obstruction  requiring gastroileal bypass of some kind.   His medications include Zocor, aspirin, and Synthroid.  This is notable  as his blood pressure today is 153/83, also at his last visit 9 months  ago was 109/65.  His lungs were clear.  Heart sounds were regular.  Extremities were without edema.   Interrogation of his Guid[employer] ICD demonstrates a P-wave of 2.8 with  impedance of 520.  Threshold volt of 0.6 at 0.4.  The R-waves  were  greater than 12 with impedance of 660 and threshold volt of 0.6 at 0.4.  He is atrially paced 55% of the time and ventriculare paced 65% of the  time.   IMPRESSION:  1. Syncope.  2. Status post pacer for syncope.   Jerome Garcia is doing well.  We will plan to see him in 6 months' time in  the Device Clinic.     Duke Salvia, MD, [facility]  Electronically Signed    SCK/MedQ  DD: 03/02/2008  DT: 03/03/2008  Job #: [id number]   cc:   Gordy Savers, MD

## 2010-05-16 NOTE — Discharge Summary (Signed)
NAME:  MUNG, RINKER NO.:  [id number]   MEDICAL RECORD NO.:  [id number]          PATIENT TYPE:  OIB   LOCATION:  2031                         FACILITY:  [facility]   PHYSICIAN:  Duke Salvia, MD, FACCDATE OF BIRTH:  1923-07-14   DATE OF ADMISSION:  03/04/2007  DATE OF DISCHARGE:  03/05/2007                               DISCHARGE SUMMARY   ALLERGIES:  NO KNOWN DRUG ALLERGIES.   Time of dictation and exam greater than 35 minutes.   FINAL DIAGNOSES:  1. History of syncope with trifascicular block.  2. Discharging day 1 status post implantation of a Guidant Insignia I      plus DR model 1297 dual-chamber pacemaker, Dr. Sherryl Manges   SECONDARY DIAGNOSES:  1. Dyslipidemia.  2. Remote history of tobacco habituation.  3. Benign prostatic hypertrophy.  4. Status post right orchiectomy.  5. Extracranial cerebrovascular occlusive disease with 100% right      internal carotid artery  stent occlusion and left internal carotid      artery 60%, continued surveillance by Dr. Arbie Cookey with the      cardiovascular thoracic surgeons.  6. Hypothyroidism, well treated.  7. Echocardiogram February 24, 2007, ejection fraction 55-60%.   PROCEDURE:  March 04, 2007:  Implant of a Guidant dual-chamber pacemaker,  Dr. Sherryl Manges.  There was some mention in the op note that there was  a possible need for repair of one of the pacing leads.  This was done  with silicon/medical adhesive.  There was no evidence of heme in the  insulation itself.   BRIEF HISTORY:  Mr. Seal is an 76 year old male.  He had an episode of  syncope while walking his dog.  There was no prodrome.  He had no  postictal symptoms.  This is his only episode.  He was referred to Dr.  Graciela Husbands for implant of a pacemaker.   HOSPITAL COURSE:  The patient presents electively on March 04, 2007.  He  underwent implantation of the dual-chamber Guidant model 1297 pacemaker  by Dr. Sherryl Manges.  Interrogation  postprocedure  day number 1 shows  that all values are within normal limits.  The patient has had no  abnormal episodes.  His chest x-ray shows no active cardiopulmonary  disease/COPD and no pneumothorax.  The patient is discharging  postprocedure day number 1.   DISCHARGE INSTRUCTIONS:  1. He is asked to keep his incision dry for the next 7 days.  2. To sponge bathe until Tuesday, March 11, 2007.  3. Mobility has been discussed with the patient.  4. He is asked not to drive for 1 week.   DISCHARGE MEDICATIONS:  1. Zocor 20 mg daily at bedtime.  2. Synthroid 150 mcg daily.  3. Enteric-coated aspirin 81 mg daily.  4. Multivitamin.  5. Vitamin E daily.   FOLLOW UP:  1. He has followup at [facility] [address]      [facility] Monday, March 17, 2007 at 9 a.m.  2. He will see Dr. Jens Som Monday, March 31, 2007 at 8:45 a.m.  3.  Dr. Graciela Husbands, he will see in June 2009.  Dr. Odessa Fleming office will call      with that appointment.   LABORATORY DATA:  Lab studies drawn February 25, 2007:  White cells 7.8,  hemoglobin 14.9, hematocrit 44.6, platelets 205, pro-time 11.5, INR 0.9,  sodium 140, potassium 5, chloride 105, carbonate 31, glucose 100, BUN  25, creatinine 1.3.      Maple Mirza, [place]      Duke Salvia, MD, [facility]  Electronically Signed    GM/MEDQ  D:  03/05/2007  T:  03/05/2007  Job:  [id number]   cc:   Gordy Savers, MD  Duke Salvia, MD, [facility]  Madolyn Frieze. Jens Som, MD, [facility]

## 2010-05-16 NOTE — Op Note (Signed)
NAME:  Jerome Garcia, Jerome Garcia NO.:  [id number]   MEDICAL RECORD NO.:  [id number]          PATIENT TYPE:  AMB   LOCATION:  ENDO                         [facility]ACILITY[facility]  [facility]   PHYSICIAN:  Georgiana Spinner, M.D.    DATE OF BIRTH:  [birth year]   DATE OF PROCEDURE:  DATE OF DISCHARGE:                               OPERATIVE REPORT   PROCEDURE:  Colonoscopy.   INDICATIONS:  Colon cancer surveillance, family history of colon cancer  in his son.   ANESTHESIA:  Fentanyl 15 mcg, Versed 2 mg.   PROCEDURE:  With the patient mildly sedated in the left lateral  decubitus position, a rectal exam was performed, which was unremarkable.  Subsequently the Pentax videoscopic pediatric colonoscope was inserted  in the rectum and passed through a tortuous colon with pressure applied  and with multiple turns to the patient to his back and to his left side.  Subsequently we were able to reach the cecum, identified by the  ileocecal valve and the base of the cecum, both of which were  photographed.  From this point the colonoscope was slowly withdrawn,  taking circumferential views of the colonic mucosa, stopping only in the  rectum, which appeared normal on direct and showed hemorrhoids on  retroflexed view.  The endoscope was straightened and withdrawn.  The  patient's vital signs, pulse oximeter remained stable.  The patient  tolerated the procedure well without apparent complications.   FINDINGS:  A rather difficult examination with tortuosity and prep was  slightly suboptimal in that there was liquid stool throughout the colon,  which was difficult to suction because whenever we tried to suction we  suctioned lax mucosa into the endoscope, but findings were negative for  any gross lesions.  At this point, probably would not recommend further  exams in this gentleman due to the difficulty of this examination and  next exam would probably not be until he is age 30.     ______________________________  Georgiana Spinner, M.D.     GMO/MEDQ  D:  07/16/2008  T:  07/17/2008  Job:  [id number]   cc:   Gordy Savers, MD  [address] [place]  [place] [address]

## 2010-05-16 NOTE — Consult Note (Signed)
NAME:  Jerome Garcia, STROLLO NO.:  [id number]   MEDICAL RECORD NO.:  [id number]          PATIENT TYPE:  INP   LOCATION:  5040                         FACILITY:  [facility]   PHYSICIAN:  Arturo Morton. Riley Kill, MD, FACCDATE OF BIRTH:  May 12, 1923   DATE OF CONSULTATION:  07/14/2007  DATE OF DISCHARGE:                                 CONSULTATION   CHIEF COMPLAINT:  I can't eat.   HISTORY OF PRESENT ILLNESS:  Mr. Kope is an 77 year old gentleman who  has been admitted to the hospital because of progressive weight loss and  inability to eat.  He has been noted to have gastric outlet obstruction  by endoscopy.  Preliminary biopsies material does not demonstrate  malignancy and as read by Dr. Delila Spence, has chronic active gastritis with  no intestinal metaplasia, dysplasia, or malignancy.  He has been seen in  consultation by Dr. Lindie Spruce.  The question has arisen with regard to  cardiac evaluation preoperatively.  Importantly, the patient provides a  history of no prior myocardial infarction in the past.  He has not had  any unstable coronary syndrome.  There has not been a history of  decompensated heart failure or significant arrhythmias, other than the  fact that he had a permanent pacemaker placed for cardiac conduction  system disease.  Previously, he would walk up to 1-2 miles with his  wife, although she said he would stop frequently and it does not sound  as though his MET level exceeded 4 METS.  Nonetheless, in February and  March of this past year, the patient did have evaluation.  Per Dr.  Ludwig Clarks note, Myoview suggested diaphragmatic attenuation without  ischemia.  His echocardiogram apparently was without significant  findings.  His ejection fraction was 55-60% without regional wall motion  abnormalities and normal wall thickness.  The interpretation was by Dr.  Tenny Craw on February 24, 2007.  The patient has had elevated sugars while in  the hospital, but no TPN, but insulin  required by the fact that he is  getting TPN currently.  He does have defined cerebrovascular disease and  importantly he has been followed by Dr. Arbie Cookey for this.  He also needs  to have abdominal surgery.   PAST MEDICAL HISTORY:  1. Syncope with heart block requiring pacing.  2. BPH.  3. History of hypothyroidism.  4. Dyslipidemia.  5. Known left internal carotid artery stenosis 50-79% on duplex      studies in April 2009.  He has had bladder cancer with surgical      resection in 1996, status post radiation and chemotherapy,      orchiectomy, and history of hemorrhoid procedures.   ALLERGIES:  There are no known drug allergies.   CURRENT MEDICATIONS:  While in the hospital include;  1. Enoxaparin 40 mg.  2. Levothyroxine 75 mcg.  3. Protonix 40 mg b.i.d.  4. Insulin.   There are several p.r.n. medications.   FAMILY HISTORY:  Negative for coronary artery disease.   SOCIAL HISTORY:  The patient is a retired Acupuncturist.  He  worked for [employer]  laboratories, lived in [place] for a bit, but mostly  he has been in the [employer].  He attended [employer].  His wife is deceased, he is remarried.  He has remote history of tobacco  use, but has not used in 25 years, and he does not consist now.  Currently consumes alcohol.   REVIEW OF SYSTEMS:  He has not had significant fevers or chills.  He  initially attempted to lose weight, but this got gradually worse.  He  denies hemoptysis.  He has not had obvious GI bleed.  There has been no  orthopnea.  There has been no exertional chest tightness.  The review of  systems is otherwise negative.   PHYSICAL EXAMINATION:  GENERAL:  He is older somewhat thin elderly  gentleman, in no acute distress.  VITAL SIGNS:  Temperature is 97.4, pulse is 60, respirations 18, blood  pressure 117/67, and equal bilaterally.  HEENT:  Unremarkable.  There is bilateral carotid bruits.  Extraocular  muscles are intact.  Pupils are equal  and reactive to light and  accommodation, and there is no asymmetry.  He is normocephalic.  LUNGS:  Clear to auscultation and percussion with slight decreased  breath sounds at the bases.  CARDIAC:  Rhythm is regular.  The PMI is nondisplaced.  There is normal  first and second heart sound.  There is perhaps a soft S4 gallop, but no  significant ejection murmur.  ABDOMEN:  Soft.  He has TPN running in through the line.  EXTREMITIES:  There is no joint effusions or edema.  NEUROLOGIC:  Grossly nonfocal.  The patient's electrocardiogram appears  to demonstrate atrial pacing.  There is a existing right bundle branch  block with left axis deviation noted.   LABORATORY DATA:  Portable chest film is read as right upper extremity  PICC line.  Temperature in the SVC pacemaker device is stable.  Lungs  are hyperaerated and clear.  CBC today reveals a hemoglobin of 12.3,  hematocrit of 36.1, white count 6200, and platelet count is 170,000.  The BUN is 14, creatinine 1.19, and potassium 3.6.  Estimated GFR is 59.  SGOT is slightly elevated to 45 and SGPT is entirely normal.  Albumin is  reduced at 2.7.  Magnesium is normal at 2.0 and phosphorus is 1.8.   IMPRESSION:  1. History of syncope with bifascicular block status post dual-chamber      pacer implantation.  2. Cerebrovascular disease with carotid duplex examination      demonstrating occlusion of the right internal carotid and 60-79% of      the left internal carotid followed by Dr. Arbie Cookey.  3. Gastric outlet obstruction possibly due to chronic gastritis.   RECOMMENDATIONS:  The risk of surgical intervention is increased by the  nature of the operation, that being an abdominal procedure, underlying  cerebrovascular disease, and possibly borderline functional capacity.  He has however had preoperative evaluation, specifically with an  echocardiogram demonstrating normal left ventricular function and also a  Myoview study demonstrating no  inducible ischemia.  He has not had  cardiac symptoms and has had no prior defined myocardial infarction.  He  currently has no unstable coronary symptoms, no known decompensated  heart failure, and no severe aortic stenosis or mitral stenosis.   His cardiovascular risk is increased by the nature of the operation, and  his baseline risk factors.  However, the operation clearly at this point  may be necessary and the necessity of  operation as guided by the need  for surgical intervention, given his gastric outlet obstruction  inability to eat.  Dr. Jens Som will see the patient, with him making  the final disposition.  I have explained in great detail the nature of  this to the patient and his family.      Arturo Morton. Riley Kill, MD, [facility]  Electronically Signed     TDS/MEDQ  D:  07/14/2007  T:  07/15/2007  Job:  [id number]   cc:   Madolyn Frieze. Jens Som, MD, [facility]  Cherylynn Ridges, M.D.  Gordy Savers, MD

## 2010-05-16 NOTE — Consult Note (Signed)
NAME:  Jerome Garcia, Jerome Garcia NO.:  [id number]   MEDICAL RECORD NO.:  [id number]          PATIENT TYPE:  INP   LOCATION:  5040                         FACILITY:  [facility]   PHYSICIAN:  Lennie Muckle, MD      DATE OF BIRTH:  08/23/1923   DATE OF CONSULTATION:  07/11/2007  DATE OF DISCHARGE:                                 CONSULTATION   CONSULTING SURGEON:  Lennie Muckle, MD   REQUESTING PHYSICIAN:  Georgiana Spinner, MD   PRIMARY CARE PHYSICIAN:  Dr. Caroll Rancher.   CARDIOLOGIST:  Madolyn Frieze. Jens Som, MD, [facility]   REASON FOR CONSULTATION:  Gastric-outlet obstruction.   HISTORY OF PRESENT ILLNESS:  Jerome Garcia is an 77 year old male patient,  most recently hospitalized in March of this year because of syncope  secondary to heart block status post dual-chamber pacemaker insertion.  He presented to the ER on July 08, 2007, because of persistent issues  over 2 weeks with nausea and vomiting and inability to eat.  The  symptoms started up gradually and became more frequent and inability to  keep solid foods down and essentially no liquids.  In the ER, his white  count was 10,900.  He has been reporting weight loss over the past year.  He was admitted with dehydration and failure to thrive.  In reviewing of  the radiographic records in e-chart, the patient did undergo a CT scan  of the abdomen and pelvis as an outpatient on July 03, 2007, that was  consistent with a gastric-outlet obstruction.  This scan showed that  after 120 minutes, the stomach did not empty out any of the contrast.  Since admission, the patient has undergone an ultrasound which showed no  significant abnormalities from his gastrointestinal standpoint, he  underwent a nuclear medicine gastric emptying study that again showed no  contrast transition after 120 minutes.  He subsequently underwent an EGD  today which revealed a gastric-outlet obstruction.  There was edema and  a mass in the pyloric region as well as  a large antral ulcer which was  biopsied.   REVIEW OF SYSTEMS:  As per the history present illness.  CONSTITUTIONAL:  The patient reports weight loss of about 50 pounds over the past one  year.  His friends have noticed that he has been losing weight and  asking him why.  The patient was not concerned since he had been making  attempts at evolution of weight loss but do feel part of this is also  nonvolitional weight loss.  GI:  The patient has had episodes of  abdominal discomfort and fullness associated with illness, but would  resolve spontaneously.  Over the past 2 weeks, it has been the worse  episode and has been unrelenting.  Otherwise, all review of systems  categories are noncontributory.   FAMILY MEDICAL HISTORY:  Noncontributory.   SOCIAL HISTORY:  The patient quit smoking about 25 years ago.  No  alcohol.  No drugs.  He is married.  His wife is in the room with him.   PAST MEDICAL HISTORY:  1. Coronary artery disease with preserved LV function.  2. Syncope status post heart block with recent pacemaker and 2009.  3. BPH.  4. Hypothyroidism.  5. Dyslipidemia.  6. Left internal carotid artery stenosis, 50-79% on duplex studies in      April 2009.   PAST SURGICAL HISTORY:  1. Dual-chamber pacemaker in March 2009 by Dr. Graciela Husbands.  2. Bladder cancer x2 with surgical resection in 1996 status post      radiation and chemotherapy treatment.  3. Infantile orchiectomy.  4. Hemorrhoid procedures.   ALLERGIES:  NKDA.   Current medications are as follows;  1. Reglan IV.  2. Protonix IV.  3. Aspirin on hold.  4. Synthroid p.o. daily.  5. Zocor at hour of sleep.  6. Lovenox for DVT prophylaxis and various other p.r.n. medications.   PHYSICAL EXAMINATION:  CONSTITUTIONAL:  GENERAL:  Pleasant older male  complaining of recent problems over the past 2 weeks of severe emesis  and weight loss.  VITAL SIGNS:  Temp 97.3, BP 109/59, pulse 84 and regular, and  respirations 20.   EYES:  Sclerae are noninjected.  Conjunctivae are pink.  Pupils are  equal and reactive to light.  EARS, NOSE, MOUTH, AND THROAT:  Ears are symmetrical.  No otorrhea.  Nose is midline.  No rhinorrhea.  Mouth oral mucous membranes are pink  and moist.  NECK:  Trachea is midline.  Thyroid is nonpalpable.  No appreciable  masses in the neck on gross inspection.  RESPIRATORY:  Effort is normal.  Bilateral lung sounds are clear to  auscultation.  He is sating 96% on room air.  CARDIOVASCULAR:  Heart  sounds S1-S2, no obvious rubs, murmurs, or gallops.  No JVD.  Pulses  regular, nontachycardiac.  No peripheral edema.  Pulses are palpable  carotid, radial, and pedal.  Carotid 1+ radial, 2+, and pedal 2+.  GI:  Abdomen is soft.  Bowel sounds are present.  Abdomen is flat.  Abdomen is nontender.  No obvious hepatosplenomegaly.  No guarding or  rebounding.  No masses.  GENITOURINARY:  Deferred.  MUSCULOSKELETAL:  Extremities are symmetrical in appearance without  clubbing or cyanosis.  SKIN:  No untoward rashes, lesions, masses, moles, scars, ulcers, etc.  NEURO:  Cranial nerves II-XII are grossly intact.  DTRs and Babinski  reflexes were not checked.  Gait is not disturbed.  The patient is  ambulatory.  Upper and lower extremities sensation is grossly intact  bilaterally.  The patient does seem somewhat hard-of-hearing  PSYCHE:  The patient is oriented to time, person, place, and situation.  Affect is appropriate to current situation.   LABORATORY DATA:  Sodium 137, potassium 3.9, CO2 22, glucose 80, BUN 25,  creatinine 1.24.  White count 7800, hemoglobin 11.5, platelets 141,000.   DIAGNOSTICS:  CT of the abdomen and pelvis, ultrasound the abdomen, and  nuclear medicine gastric emptying study as noted, EGD as noted.   IMPRESSION:  1. Gastric-outlet obstruction secondary to pyloric stricture versus      mass associated edema.  2. Gastric antrum ulcer, biopsy pending.  3. Suspected protein  calorie malnutrition in setting of significant      weight loss over 1 year recent nausea and vomiting.  4. Coronary artery disease currently asymptomatic.  5. Left internal carotid artery stenosis 50-79%.  6. Recent dual-chamber pacemaker insertion due to heart block.   PLAN:  1. The patient will eventually need gastrojejunostomy versus Billroth      procedure before discharge.  2.  Due to the prolongation of symptoms with weight loss over 1 year,      and recent nausea and vomiting, and need to maintain nutrition, we      will check a prealbumin to determine baseline nutritional status.      We will place a PICC line and start T&A preoperatively to help to      the patient up for operative intervention hopefully by next week.  3. Operative procedure has been described in detail per Dr. Freida Busman.      The patient and family are aware that depending on the location of      the area, they may not be able to completely resect out the area      and may only undergo a bypass procedure only.  This final      determination will be made during the procedure.  4. We need to follow up on any biopsy results preoperatively.  5. The patient may need cardiac clearance preoperatively.      Allison L. Kennith Center, MD  Electronically Signed    ALE/MEDQ  D:  07/11/2007  T:  07/12/2007  Job:  [id number]   cc:   Dr. Marcine Matar. Jens Som, MD, [facility]  Georgiana Spinner, M.D.

## 2010-05-16 NOTE — Op Note (Signed)
NAME:  Jerome Garcia, Jerome Garcia NO.:  [id number]   MEDICAL RECORD NO.:  [id number]          PATIENT TYPE:  INP   LOCATION:  5040                         FACILITY:  [facility]   PHYSICIAN:  Georgiana Spinner, M.D.    DATE OF BIRTH:  01/17/1923   DATE OF PROCEDURE:  07/11/2007  DATE OF DISCHARGE:                               OPERATIVE REPORT   PROCEDURE:  Upper endoscopy.   INDICATIONS:  Abdominal pain, rule out obstruction.   ANESTHESIA:  Fentanyl 50 mcg, Versed 4 mg.   PROCEDURE IN DETAIL:  With the patient mildly sedated in the left  lateral decubitus position, the Pentax videoscopic endoscope was  inserted in the mouth and passed under direct vision through the  esophagus which was quite inflamed and almost through its entire length  the tissue was friable and there were ulcerations noted.  We entered  into the stomach.  There was a minimal amount of brownish fluid seen in  the stomach and the fundus, which was photographed.  The remainder of  the mucosa appeared normal in the fundus and body.  Antrum, however,  showed deep ulcerations and thickening edema.  In the prepyloric area, I  could only find the pyloric opening once, then it covered with some  blood from the friable tissue, and I elected then to not pursue trying  to get through this.  At this point, the endoscope was placed in  retroflexion to view the stomach from below.  The endoscope was then  straightened and biopsies were taken of the prepyloric area and then the  endoscope was withdrawn taking circumferential views of remaining  gastric and esophageal mucosa.  The patient's vital signs and pulse  oximeter remained stable.  The patient tolerated the procedure well  without apparent complications.   FINDINGS:  Partial obstruction to the gastric outlet and that there is  only a minimal amount of fluid remaining in the stomach at this point  with ulceration and edema of the antrum along with ulceration of the  esophagus.   IMPRESSION:  Hyperacidity syndrome, possibly just due to nonsteroidal  anti-inflammatory drugs, but rule out malignancy as well.  We will also  get a serum gastrin level and we will call surgery and make the patient  n.p.o.           ______________________________  Georgiana Spinner, M.D.     GMO/MEDQ  D:  07/11/2007  T:  07/11/2007  Job:  [id number]

## 2010-05-16 NOTE — Assessment & Plan Note (Signed)
[facility]                            CARDIOLOGY OFFICE NOTE   NAME:Jerome Garcia                       MRN:          [id number]  DATE:02/24/2007                            DOB:          December 11, 1923    [name] is a very pleasant, 77 year old gentleman who I am asked to  evaluate for syncope.  He has no prior cardiac history.  He typically  does not have dyspnea on exertion, orthopnea, PND, pedal edema,  palpitations or pedal edema. Approximately 5 days ago, the patient was  walking his dog. This is not unusual for him. He states that as he  neared the end of his mile he had a frank syncopal episode. There was no  preceding symptoms. There was no chest pain, shortness of breath,  nausea, vomiting, palpitations.  He did not lose control of his bowels  or his bladder nor is there any weakness or loss of strength, or  sensation in his extremities.  There is no dysarthria.  He does not know  how long he was out, he just remembers getting off the ground.  He felt  fine afterwards.  He saw Dr. Amador Cunas and we were asked to further  evaluate.   CURRENT MEDICATIONS:  1. Zocor 20 mg p.o. daily.  2. Synthroid 125 mcg p.o. daily.  3. Multivitamin.  4. Vitamin E.  5. Aspirin 81 mg p.o. daily.  6. He has no known drug allergies.   SOCIAL HISTORY:  He has a remote history of tobacco use but has not  smoked in 25 years.  He does not consume alcohol.   FAMILY HISTORY:  Negative for coronary artery disease.   PAST MEDICAL HISTORY:  Significant for hyperlipidemia, but there is no  diabetes mellitus or hypertension.  He does have a history of  significant carotid disease and is followed by Dr. Arbie Cookey for this. He  has had a prior hemorrhoidectomy. He has also had a previous bladder  cancer that has been treated with scraping.  He also has  hypothyroidism.  He has benign prostatic hypertrophy.  He has had a  previous orchiectomy.   REVIEW OF SYSTEMS:  He  denies any headaches or fevers, or chills.  There  is no productive cough or hemoptysis. There is no dysphagia,  odynophagia, melena or hematochezia.  There is no dysuria or hematuria.  There is no rash or seizure activity.  There is no orthopnea, PND, or  pedal edema.  There is no claudication noted.  The remaining systems are  negative.   PHYSICAL EXAMINATION:  VITAL SIGNS:  Blood pressure of 153/79.  His  pulse is 65.  He weighs 154 pounds.  HEENT:  Normal with normal eyelids.  GENERAL:  He is well-developed, well-nourished in no acute distress.  SKIN:  Warm and dry. He does not appear to be depressed and there is no  peripheral clubbing.  BACK:  Normal.  NECK:  Supple with a normal upstroke bilaterally. There are bilateral  carotid bruits, left is louder than right. There is no jugular venous  distention and no  thyromegaly is noted. CHEST:  Clear to auscultation in  all expansions.  CARDIOVASCULAR:  Reveals distant heart sounds.  There is a regular rate  and rhythm.  I cannot appreciate murmurs, rubs or gallops.  ABDOMEN:  Nontender, nondistended, positive bowel sounds, no  hepatosplenomegaly.  There is a question of a pulsatile mass. There is  no bruit noted.  He has 2+ femoral pulses bilaterally and no bruits.  EXTREMITIES:  Show no edema and I can palpate no cords.  He is 2+  posterior pulses bilaterally.  NEUROLOGIC:  Grossly intact.   His electrocardiogram shows a sinus bradycardia at a rate of 53.  There  is a first-degree AV block. There is right bundle branch block as well  as a left anterior fascicular block.  A prior septal infarct cannot be  excluded.   DIAGNOSIS:  1. Syncope - Mr. Hassebrock presents with a frank syncopal episode.  He      has conduction abnormalities on his electrocardiogram including a      right bundle branch block, first-degree AV block and left anterior      fascicular block.  I have discussed the patient with Dr. Graciela Husbands and      he will see him  later today for evaluation of pacemaker placement.      We will also schedule him to have an echocardiogram to quantify his      LV function as well as a stress Myoview.  2. Carotid disease - he will follow up with Dr. Arbie Cookey concerning this      issue.  3. Question pulsatile mass on abdominal exam - the patient apparently      has had an abdominal ultrasound previously for this particular      issue.  We will ask Dr. Amador Cunas to forward it to [place] for our      records.  4. Hyperlipidemia - he will continue on his Zocor. Given his history      of documented cerebrovascular disease, I would recommend increasing      this as needed to have an LDL of less than 70. I will leave this to      Dr. Amador Cunas.   I have asked the patient not to drive until he has his pacemaker placed.  This also assumes that his echocardiogram and Myoview are normal.  We  will see him back in approximately 6 weeks to review the above.     Madolyn Frieze Jens Som, MD, [facility]  Electronically Signed    BSC/MedQ  DD: 02/24/2007  DT: 02/24/2007  Job #: [id number]   cc:   Gordy Savers, MD

## 2010-05-16 NOTE — Assessment & Plan Note (Signed)
OFFICE VISIT   LIZANDRO, SPELLMAN  DOB:  1923/11/03                                       04/18/2007  GNFAO#:[id number]   The patient presents today for continued followup of his asymptomatic  carotid disease.  He has a known occluded right coronary artery and had  a long followup for his moderate to severe left carotid stenosis.  He  specifically denies any amaurosis fugax, transient ischemic attack, or  stroke.  He has had a pacer placed in the last 1 to 2 months with a slow  heart rate.  He did have a syncopal episode due to the slow heart rate.  Otherwise, he does not smoke and no other cardiac disease.   PHYSICAL EXAM:  Well-developed, well-nourished white male appearing  stated age of 34.  Blood pressure is 103/74, pulse 63, respirations 16.  He is grossly intact neurologically.  He does have a soft left carotid  bruit.  No bruit on the right.  His radial pulses are 2+ bilaterally.  He underwent a repeat carotid duplex, and this reveals no significant  change and really no significant change since his initial studies in  2003.  He does have occluded right internal carotid artery and a  moderate to severe stenosis in the left.  I discussed this with the  patient and his wife.  They will notify should this occur.  Otherwise,  we will see him again for vascular lab studies in 1 year for carotid  protocol followup.   Larina Earthly, M.D.  Electronically Signed   TFE/MEDQ  D:  04/18/2007  T:  04/21/2007  Job:  [id number]   cc:   Gordy Savers, MD

## 2010-05-16 NOTE — Op Note (Signed)
NAME:  CAMRYN, QUESINBERRY NO.:  [id number]   MEDICAL RECORD NO.:  [id number]          PATIENT TYPE:  INP   LOCATION:  5040                         FACILITY:  [facility]   PHYSICIAN:  Cherylynn Ridges, M.D.    DATE OF BIRTH:  1923-03-19   DATE OF PROCEDURE:  07/16/2007  DATE OF DISCHARGE:                               OPERATIVE REPORT   PREOPERATIVE DIAGNOSIS:  Gastric outlet obstruction.   POSTOPERATIVE DIAGNOSIS:  Gastric outlet obstruction, likely secondary  to ulcer disease in the second portion of duodenum.   PROCEDURE:  Gastrojejunostomy.   SURGEON:  Marta Lamas. Lindie Spruce, MD   ASSISTANT:  Claude Manges. Cleophas Dunker, MD   ANESTHESIA:  General endotracheal.   ESTIMATED BLOOD LOSS:  Less than 50 mL.   COMPLICATIONS:  None.   CONDITION:  Stable.   FINDINGS:  The patient had evidence of palpable sort of swelling and  scarring in the second portion of the duodenum near the head of the  pancreas.  There was no overt mass.  The gallbladder appeared to be  normal.  No other significant intra-abdominal process.   INDICATIONS FOR OPERATION:  The patient is an 77 year old gentleman with  symptomatic gastric outlet obstruction who comes in for  gastrojejunostomy.   OPERATION:  The patient was taken to the operating room, placed on the  table in supine position.  After an adequate general endotracheal  anesthetic was administered, he was prepped and draped in usual sterile  manner exposing the midline.   A midline incision was made using #10 blade down to the midline fascia  and taken from the xyphoid to just below the umbilicus.  We opened into  the peritoneal cavity carefully, avoid injuring the bowel.  We inspected  the stomach, which appeared to be decompressed but elongated from  previous obstruction; he has an NG-tube in place.  We palpated down to  the pylorus where you can feel the pyloric knob and then did a Kocher  maneuver mobilized in the second and third portion of the  duodenum but  not a complete Kocher.  There appeared to be some palpable fullness in  the second portion of the duodenum without a separate mass.  There was  no evidence of any liver extension.  The gallbladder was attached to the  duodenal sweep by just filmy adhesions, but these were taken down easily  without damage to the gallbladder itself.   We did a retrocolic gastrojejunostomy, taken the jejunum approximately  20 cm distal to the ligament of Treitz and then attaching into the  posterior wall of the stomach.  We went through the mesentery of the  colon to the left and middle colic vessels.  The gastrojejunostomy was  performed using a GIA-75 stapler with the resulting enterotomy being  closed with a TX 60 staple through 0.5-mm closure of blue staples.   We tagged the loop of bowel going retrocolic to the mesentery using  interrupted 3-0 silk sutures.  This laid down easily and nicely.  There  appeared to be no obstruction.   Once the gastrojejunostomy was  performed, we irrigated with saline  solution.  The wound was closed with double-stranded loop PDS suture.  The skin was closed using stainless staples.  Sterile dressing was  applied.  All needle counts, sponge counts, and instrument counts were  correct.      Cherylynn Ridges, M.D.  Electronically Signed     JOW/MEDQ  D:  07/16/2007  T:  07/17/2007  Job:  [id number]

## 2010-05-16 NOTE — H&P (Signed)
NAME:  Jerome Garcia, Jerome Garcia NO.:  [id number]   MEDICAL RECORD NO.:  [id number]          PATIENT TYPE:  OIB   LOCATION:  2899                         FACILITY:  [facility]   PHYSICIAN:  Duke Salvia, MD, FACCDATE OF BIRTH:  [birth year]   DATE OF ADMISSION:  03/04/2007  DATE OF DISCHARGE:                              HISTORY & PHYSICAL   PREOPERATIVE DIAGNOSIS:  Syncope with bifascicular block.   POSTOPERATIVE DIAGNOSIS:  Syncope with bifascicular block.   PROCEDURE:  Dual-chamber pacemaker implantation and repair of the one of  the pacing leads.   DESCRIPTION OF PROCEDURE:  Following obtaining informed consent, the  patient was brought to the electrophysiology laboratory and placed on  the fluoroscopic table in supine position.  After routine prep and drape  of the left upper chest, lidocaine was infiltrated in prepectoral  subclavicular region.  Incision was made and carried down to layer of  the prepectoral fascia with electrocautery and sharp dissection.  A  pocket was formed similarly.  Hemostasis was obtained.   Thereafter attention was turned to gaining access to extrathoracic left  subclavian vein which was accomplished without difficulty, without the  aspiration of air or puncture of the artery.  Two separate venipunctures  were accomplished.  Guidewires were placed and retained sequentially.  A  7-French sheath was placed through which was passed a Medtronic 5076 58-  cm Active Fixation ventricular lead, serial number [id number] and atrial  lead 5076 52-cm Active Fixation lead, serial number [id number]. Under  fluoroscopic guidance, the RV lead was manipulated to the RV septum  where the amplitude was 12.2 with a pace impedance of 1347 ohms,  threshold 0.8 volts at 0.5 milliseconds.  Current threshold 0.8 MA.  There is no diaphragmatic pacing at 10 volts andthe current of injury  was brisk.  This lead was secured to the prepectoral fascia.   We then had to  explore in a multitude of sites in the right atrium to  find a place that sensed and paced adequately.  The mouth of the  appendage and the inferior posterior wall were all mapped and found to  be inadequate.  Placed in the mid lateral wall had an amplitude of 2, a  pace impedance of 874, threshold 1.2 volts at 0.5 milliseconds.  Current  threshold 1.5 MA.  There is no diaphragmatic pacing at 10 volts.  The  current of injury was brisk.   The ventricular lead was marked with a tie.   The leads were then attached to a Guidant 1297 pulse generator, serial  number [id number].  The pocket was copiously irrigated with antibiotic  containing saline solution.  Hemostasis was assured and the leads of the  pulse generator then placed in the pocket, secured to the prepectoral  fascia.  While I was sewing up, however, I felt a resistance that was  unusual and as I opened the pocket, at that point I noticed that one of  the leads popped on top of the generator can itself.  It was my concern  that it had extra resistance, so it might  have been the lead and so that  lead was then repaired and enveloped in silicon/medical adhesive,  notwithstanding the fact that I did not see any evidence of heme in the  insulation itself.  The device was then freed up.  The leads were then  reapplied posterior the can resewn.  The  wound was then closed in two layers in normal fashion.  The wound was  washed and dried and a benzoin Steri-Strip dressing was applied.  Needle  count, sponge counts and instrument counts were correct at the end of  procedure according to staff.  The patient tolerated the procedure  without apparent complication.      Duke Salvia, MD, [facility]  Electronically Signed     SCK/MEDQ  D:  03/04/2007  T:  03/04/2007  Job:  [id number]   cc:   Madolyn Frieze. Jens Som, MD, [facility]  Electrophysiology Lab  [facility]

## 2010-05-16 NOTE — Op Note (Signed)
NAME:  STEPHANOS, FAN NO.:  [id number]   MEDICAL RECORD NO.:  [id number]          PATIENT TYPE:  AMB   LOCATION:  ENDO                         [facility]ACILITY:  [facility]   PHYSICIAN:  Georgiana Spinner, M.D.    DATE OF BIRTH:  03/17/1923   DATE OF PROCEDURE:  07/16/2008  DATE OF DISCHARGE:                               OPERATIVE REPORT   PROCEDURE:  Upper endoscopy.   INDICATION:  Rule out obstruction.   ANESTHESIA:  Fentanyl 35 mcg, Versed 3 mg.   PROCEDURE IN DETAIL:  With the patient mildly sedated in the left  lateral decubitus position the Pentax videoscopic endoscope was inserted  in the mouth, passed under direct vision through the esophagus which  appeared normal.  We entered into the stomach, fundus, body, antrum were  visualized and the pylorus was fairly tight.  I could see through this  but I could not advance the scope through the pylorus so it was probably  under a centimeter in diameter.  A photograph was taken.  There was no  ulceration seen.  From this point the endoscope was placed in  retroflexion to view the stomach from below.  The endoscope was then  straightened and withdrawn taking circumferential views of the gastric  mucosa stopping to take biopsies from erythematous measles like rash  distribution in the body and fundus of the stomach.  The endoscope was  withdrawn.  The patient's vital signs, pulse oximeter remained stable.  The patient tolerated the procedure well without apparent complication.   FINDINGS:  Tight pylorus with scattered confluent patchy erythema of the  body and fundus of the stomach consistent with possible Helicobacter  pylori gastritis.  Await biopsy report.  The patient will call me for  results and will discuss his clinical status to determine when and/or if  he will need a pyloric dilation, may have him follow up with [facility]  subsequently as well.           ______________________________  Georgiana Spinner,  M.D.     GMO/MEDQ  D:  07/16/2008  T:  07/17/2008  Job:  [id number]   cc:   Gordy Savers, MD  [address] [place]  [place] [address]

## 2010-05-16 NOTE — Procedures (Signed)
CAROTID DUPLEX EXAM   INDICATION:  Followup evaluation of known carotid artery disease.   HISTORY:  Diabetes:  No.  Cardiac:  Coronary artery bypass graft in 1993.  Hypertension:  Yes.  Smoking:  Yes.  Previous Surgery:  History of abdominal aortic aneurysm repair.  CV History:  Previous duplex on 04/18/2007 revealed an occluded right  ICA and 60-79% left ICA stenosis.  Amaurosis Fugax No, Paresthesias No, Hemiparesis No                                       RIGHT             LEFT  Brachial systolic pressure:         144               152  Brachial Doppler waveforms:         Triphasic         Triphasic  Vertebral direction of flow:        Antegrade         Antegrade  DUPLEX VELOCITIES (cm/sec)  CCA peak systolic                   48                59  ECA peak systolic                   95                133  ICA peak systolic                   Occluded          244  ICA end diastolic                   Occluded          102  PLAQUE MORPHOLOGY:                  Mixed             Mixed focal  PLAQUE AMOUNT:                      Severe            Moderate to severe  PLAQUE LOCATION:                    Throughout ICA    ICA origin   IMPRESSION:  1. Occluded right ICA.  2. 60-79% left ICA stenosis (high end of range).  No significant      change since previous study performed 04/18/2007.   ___________________________________________  Larina Earthly, M.D.   MC/MEDQ  D:  04/23/2008  T:  04/23/2008  Job:  [id number]

## 2010-05-16 NOTE — Assessment & Plan Note (Signed)
[facility]                         ELECTROPHYSIOLOGY OFFICE NOTE   NAME:Jerome Garcia, Jerome Garcia                       MRN:          [id number]  DATE:06/10/2007                            DOB:          1923-02-11    Jerome Garcia is seen following pacemaker implantation for syncope with  bifascicular block.  He has had no recurrent syncope.  He has had some  problems with lightheadedness with changes in position.   His medications currently include only Synthroid and Zocor.   On examination his blood pressure is 109/65 with a pulse of 60.  His lungs were clear.  His heart sounds were regular.  Extremities were without peripheral edema.  His skin was warm and dry.   INTERROGATION OF HIS PACEMAKER:  Demonstrated an R wave of an impedance  of 680, threshold of 0.7 at 0.4.  The P wave was 3.3, with an impedance  of 530, threshold 0.4 at 0.4.  He is atrial paced at 63% of the time and  ventricular paced at 74% of the time.  An intrinsic conduction is not  really recoverable, except at very low heart rates.  His device was  reprogrammed.   IMPRESSION:  1. Syncope.  2. Bifascicular block.  3. Status post pacer for the above without recurrence of syncope.  4. Orthostatic lightheadedness.   We have discussed, at length, different maneuvers to try to mitigate  against orthostatic syncope.  We discussed options at night as well as  during the day.   We will plan to see him, again, in 9 months' time.     Duke Salvia, MD, [facility]  Electronically Signed    SCK/MedQ  DD: 06/10/2007  DT: 06/10/2007  Job #: [id number]   cc:   Gordy Savers, MD

## 2010-05-16 NOTE — Procedures (Signed)
CAROTID DUPLEX EXAM   INDICATION:  Carotid disease.   HISTORY:  Diabetes:  No.  Cardiac:  CABG.  Hypertension:  Yes.  Smoking:  Yes.  Previous Surgery:  History of abdominal aortic aneurysm repair.  CV History:  Currently asymptomatic.  Amaurosis Fugax No, Paresthesias No, Hemiparesis No.                                       RIGHT             LEFT  Brachial systolic pressure:         106               110  Brachial Doppler waveforms:         Normal            Normal  Vertebral direction of flow:        Antegrade         Antegrade  DUPLEX VELOCITIES (cm/sec)  CCA peak systolic                   34                48  ECA peak systolic                   86                135  ICA peak systolic                   Occluded          255  ICA end diastolic                                     76  PLAQUE MORPHOLOGY:                  Mixed             Mixed  PLAQUE AMOUNT:                      Occlusive         Moderate/severe  PLAQUE LOCATION:                    ICA               ICA   IMPRESSION:  1. Known occlusion of the right internal carotid artery.  2. 60% to 79% stenosis of the left internal carotid artery.  3. No significant change noted when compared to the previous      examination of 04/23/2008.   ___________________________________________  Larina Earthly, M.D.   CH/MEDQ  D:  04/07/2009  T:  04/07/2009  Job:  [id number]

## 2010-05-16 NOTE — Discharge Summary (Signed)
NAME:  Jerome Garcia, Jerome Garcia NO.:  [id number]   MEDICAL RECORD NO.:  [id number]          PATIENT TYPE:  INP   LOCATION:  3039                         FACILITY:  [facility]   PHYSICIAN:  Raenette Rover. Felicity Coyer, MDDATE OF BIRTH:  1923-04-16   DATE OF ADMISSION:  07/08/2007  DATE OF DISCHARGE:  07/29/2007                               DISCHARGE SUMMARY   DISCHARGE DIAGNOSES:  1. Gastric outlet obstruction secondary to pyloric ulcer status post      gastrojejunostomy, July 16, 2007.  2. Mild protein/calorie malnutrition secondary to gastric outlet      obstruction secondary to pyloric ulcer status post      gastrojejunostomy, July 16, 2007.  3. Hypothyroid.   HISTORY OF PRESENT ILLNESS:  Mr. Paparella is an 77 year old white male who  was admitted on July 08, 2007, with chief complaint of nausea, vomiting,  abdominal discomfort, and weight loss.  He noted that he had lost 8  pounds unintentionally since June 19, 2007.  He was seen several times  in the office with complaints of abdominal discomfort, nausea, and  vomiting.  He was initially treated for constipation without relief of  symptoms.  CT abdomen and pelvis performed on July 03, 2007, ? pyloric  inflammation, otherwise negative.  Ultrasound performed on July 08, 2007,  was within normal limits.  He denied abdominal pain but noted positive  belching.  He was admitted for further evaluation and treatment.   PAST MEDICAL HISTORY:  1. Hyperlipidemia.  2. Hypothyroidism.  3. History of prostate cancer status post prostatectomy.  4. BPH.  5. Bladder cancer.  6. DVT.  7. Crohn disease.  8. Status post pacemaker insertion.   COURSE OF HOSPITALIZATION:  Gastric outlet obstruction secondary to  pyloric ulcer.  The patient was admitted and was initially seen by Dr.  Virginia Rochester of GI.  He underwent an endoscopy performed on July 11, 2007, which  revealed benign versus malignant ulcer at the antrum with obstruction.  He also underwent a  gastric emptying study on July 10, 2007, which noted  no emptying of material from the stomach during the 120-minute  observation.  A surgical consult was requested, and the patient was seen  in consultation by Dr. Bertram Savin of Surgery.  He subsequently  underwent a gastrojejunostomy performed on July 16, 2007.  Postoperatively, his course was uneventful.  He was maintained n.p.o.  for several days postoperatively with an NG tube to low wall suction.  He ultimately was able to be advanced to a clear liquid diet and is now  tolerating full liquid diet without difficulty.  His pain was well  controlled.  He was maintained on TNA  while he was n.p.o.  The surgery  was performed by Dr. Jimmye Norman.  Also, of note, the pathology from the  endoscopy of the suspicious lesion noted chronic active gastritis with  no intestinal metaplasia, dysplasia, or malignancy.  In addition, no  Helicobacter pylori organisms were identified.  He was noted to have one  blood culture positive for diphtheroids out of 2 cultures and this was  thought  to be contamination.   MEDICATIONS AT THE TIME OF DISCHARGE:  1. Levothyroxine 150 mcg p.o. daily.  2. Aspirin 81 mg p.o. daily.  3. Zocor 20 mg p.o. daily.  4. Multivitamin 1 tablet p.o. daily.  5. Vitamin E 400 mg p.o. daily.  6. Protonix 40 mg p.o. b.i.d.   PERTINENT LABORATORIES AT THE TIME DISCHARGE:  BUN 23, creatinine 1.05.  sodium 140, and potassium 4.0.  Hemoglobin 10.1, hematocrit 30.3, white  blood cell count 5.8, and platelets 215.   DISPOSITION:  He will be discharged to home.   DIET AT THE TIME OF DISCHARGE:  Heart healthy and low cholesterol.   FOLLOWUP:  He is scheduled to follow up with Dr. Eleonore Chiquito on  Tuesday August 05, 2007, at 1:15 p.m. and to follow up with Dr. Lindie Spruce in  2 weeks and contact the office for an appointment.   SPECIAL INSTRUCTIONS:  He was instructed to allow Steri-Strips to fall  off.  To increase activity slowly.   He may shower, however he is not to  take a bath until incision is fully healed.  He was instructed not to  drive for 2 weeks and not to lift greater than 10 pounds.  He was also  instructed to call Dr. Lindie Spruce if he develops nausea, vomiting, fever  greater than 101, or abdominal pain or go directly to the emergency room  if severe.   Greater than 30 minutes were spent on discharge planning.      Sandford Craze, NP      Raenette Rover. Felicity Coyer, MD  Electronically Signed    MO/MEDQ  D:  07/29/2007  T:  07/29/2007  Job:  [id number]   cc:   Gordy Savers, MD  Cherylynn Ridges, M.D.

## 2010-05-16 NOTE — Op Note (Signed)
NAME:  Jerome Garcia, Jerome Garcia NO.:  [id number]   MEDICAL RECORD NO.:  [id number]          PATIENT TYPE:  AMB   LOCATION:  ENDO                         FACILITY:  [facility]   PHYSICIAN:  Georgiana Spinner, M.D.    DATE OF BIRTH:  January 24, 1923   DATE OF PROCEDURE:  07/16/2008  DATE OF DISCHARGE:                               OPERATIVE REPORT   ADDENDUM:  Subsequent to this, I found that he has a gastrojejunostomy  that is functioning, and he obviously decompresses through this.  Folds  are somewhat thickened around that.   PLAN:  We will just await biopsy and forego plan as described above  unless he becomes symptomatic.  At this point, I think he should do  rather well.  Only issue will be whether he is Helicobacter pylori  positive at which point I would probably treat him if possible.  Since  he is not symptomatic, I do not know that I be concerned at all about  his narrow pylorus since he has been decompressed.           ______________________________  Georgiana Spinner, M.D.     GMO/MEDQ  D:  07/16/2008  T:  07/17/2008  Job:  [id number]

## 2010-05-16 NOTE — Procedures (Signed)
CAROTID DUPLEX EXAM   INDICATION:  Followup evaluation of known carotid artery disease.   HISTORY:  Diabetes:  No.  Cardiac:  Coronary artery bypass graft in 1993.  Hypertension:  Yes.  Smoking:  Yes.  Previous Surgery:  Abdominal aortic aneurysm repair and coronary artery  bypass graft.  CV History:  Previous duplex on April 19, 2006, revealed a 60-79% right  ICA stenosis and a 40-59% left ICA stenosis.  The patient had a syncopal  episode in February of 2009.  Amaurosis Fugax No, Paresthesias No, Hemiparesis No                                       RIGHT             LEFT  Brachial systolic pressure:         118               120  Brachial Doppler waveforms:         Triphasic         Triphasic  Vertebral direction of flow:        Antegrade         Antegrade  DUPLEX VELOCITIES (cm/sec)  CCA peak systolic                   68                57  ECA peak systolic                   93                100  ICA peak systolic                   No flow identified                  225  ICA end diastolic                   No flow identified                  94  PLAQUE MORPHOLOGY:                  Soft              Mixed  PLAQUE AMOUNT:                      Severe            Moderate to severe  PLAQUE LOCATION:                    Throughout ICA    Proximal ICA   IMPRESSION:  1. Right ICA is not visualized, probably occluded; however, cannot      rule out trickle flow.  2. Left ICA stenosis 60-79%, although elevated velocities may be      secondary to contralateral occlusion.   ___________________________________________  Larina Earthly, M.D.   MC/MEDQ  D:  04/18/2007  T:  04/18/2007  Job:  [id number]

## 2010-05-19 NOTE — Assessment & Plan Note (Signed)
[facility] OFFICE NOTE   NAME:Jerome Garcia, Jerome Garcia                       MRN:          [id number]  DATE:03/05/2006                            DOB:          15-Oct-1923    An 77 year old gentleman seen today for an annual exam.  He has history  of carotid artery stenosis and is followed closely by CVTS.  He has  hyperlipidemia, history trifascicular block, hypothyroidism, and BPH.  He is doing quite well, denies any cardiopulmonary complaints.  He has  history of bladder cancer in 1996, remote hemorrhoidectomy.  He has also  had a prior orchiectomy.  Medical regimen reviewed.  Family history  unchanged.   EXAMINATION:  GENERAL:  Revealed an elderly gentleman in no acute  distress.  VITAL SIGNS:  Blood pressure was well controlled and symmetrical.  HEENT:  Fundi, ear, nose and throat clear.  NECK:  Revealed a left carotid bruit and a diminished right carotid  upstroke.  CHEST:  Clear.  CARDIOVASCULAR:  Normal heart sounds, no murmurs.  Heart sounds were  quite faint.  ABDOMEN:  Soft and nontender.  Did have prominent aortic pulsation.  EXTERNAL GENITALIA:  Revealed a right inguinal hernia.  Right testicle  was atrophic.  RECTAL:  Prostate was +2-3 and benign.  Stool heme negative.  EXTREMITIES:  Revealed intact posterior tibial pulses.  Dorsalis pedis  pulses were absent.  NEUROLOGIC:  Negative.   IMPRESSION:  1. Hypothyroidism.  2. Carotid artery stenosis.  3. History trifascicular block.  4. Dyslipidemia.  5. Benign prostatic hypertrophy.   DISPOSITION:  Medical regimen unchanged.  Laboratory update will be  reviewed.  He will continue his annual carotid artery Doppler  ultrasounds.  Return here in 6 months for followup.     Gordy Savers, MD  Electronically Signed    PFK/MedQ  DD: 03/05/2006  DT: 03/05/2006  Job #: [id number]

## 2010-05-19 NOTE — Op Note (Signed)
NAME:  Jerome Garcia, Jerome Garcia NO.:  [id number]   MEDICAL RECORD NO.:  [id number]                   PATIENT TYPE:  AMB   LOCATION:  ENDO                                 FACILITY:  [facility]   PHYSICIAN:  Georgiana Spinner, M.D.                 DATE OF BIRTH:  [birth year]   DATE OF PROCEDURE:  05/24/2003  DATE OF DISCHARGE:                                 OPERATIVE REPORT   PROCEDURE:  Colonoscopy.   INDICATIONS:  History of colon polyps.   ANESTHESIA:  1. Fentanyl 40 mcg.  2. Versed 4 mg.   DESCRIPTION OF PROCEDURE:  With patient mildly sedated in the left lateral  decubitus position, a rectal examination was performed which was  unremarkable.  Subsequently, the Olympus videoscopic colonoscope was  inserted in the rectum, passed through a tortuous colon to the cecum,  identified by ileocecal valve and base of cecum, both of which were  photographed.  From this point, the colonoscope was slowly withdrawn taking  circumferential views of the colonic mucosa, stopping only in the rectum  which appeared normal on direct and retroflexed view.  The endoscope was  straightened and withdrawn.  The patient's vital signs and pulse oximeter  remained stable.  The patient tolerated the procedure well without  apparently complications.   FINDINGS:  Unremarkable examination other than some diverticula seen in the  sigmoid colon, somewhat tortuous sigmoid colon.   PLAN:  Consider repeat examination if appropriate in 5 years.                                               Georgiana Spinner, M.D.    GMO/MEDQ  D:  05/24/2003  T:  05/24/2003  Job:  [id number]   cc:   Gordy Savers, M.D. [facility]

## 2010-06-01 ENCOUNTER — Ambulatory Visit (INDEPENDENT_AMBULATORY_CARE_PROVIDER_SITE_OTHER): Payer: Self-pay | Admitting: Internal Medicine

## 2010-06-01 ENCOUNTER — Encounter: Payer: Self-pay | Admitting: Internal Medicine

## 2010-06-01 VITALS — BP 100/70 | HR 62 | Temp 97.6°F | Resp 16 | Ht 69.5 in | Wt 150.0 lb

## 2010-06-01 DIAGNOSIS — E039 Hypothyroidism, unspecified: Secondary | ICD-10-CM

## 2010-06-01 DIAGNOSIS — Z23 Encounter for immunization: Secondary | ICD-10-CM

## 2010-06-01 DIAGNOSIS — Z0000 Encounter for general adult medical examination without abnormal findings: Secondary | ICD-10-CM

## 2010-06-01 DIAGNOSIS — I6529 Occlusion and stenosis of unspecified carotid artery: Secondary | ICD-10-CM

## 2010-06-01 DIAGNOSIS — E785 Hyperlipidemia, unspecified: Secondary | ICD-10-CM

## 2010-06-01 LAB — HEPATIC FUNCTION PANEL
Albumin: 3.7 g/dL (ref 3.5–5.2)
Alkaline Phosphatase: 60 U/L (ref 39–117)

## 2010-06-01 LAB — CBC WITH DIFFERENTIAL/PLATELET
Basophils Absolute: 0 10*3/uL (ref 0.0–0.1)
Eosinophils Absolute: 0.1 10*3/uL (ref 0.0–0.7)
Hemoglobin: 15 g/dL (ref 13.0–17.0)
Lymphocytes Relative: 26.5 % (ref 12.0–46.0)
Lymphs Abs: 1.5 10*3/uL (ref 0.7–4.0)
MCHC: 33.7 g/dL (ref 30.0–36.0)
Neutro Abs: 3.3 10*3/uL (ref 1.4–7.7)
Platelets: 155 10*3/uL (ref 150.0–400.0)
RDW: 14.8 % — ABNORMAL HIGH (ref 11.5–14.6)

## 2010-06-01 LAB — BASIC METABOLIC PANEL
BUN: 23 mg/dL (ref 6–23)
CO2: 30 mEq/L (ref 19–32)
Calcium: 9.1 mg/dL (ref 8.4–10.5)
Creatinine, Ser: 1.4 mg/dL (ref 0.4–1.5)
Glucose, Bld: 87 mg/dL (ref 70–99)
Sodium: 143 mEq/L (ref 135–145)

## 2010-06-01 LAB — LIPID PANEL
Cholesterol: 119 mg/dL (ref 0–200)
HDL: 54.1 mg/dL (ref >39.00)
VLDL: 14.6 mg/dL (ref 0.0–40.0)

## 2010-06-01 MED ORDER — SIMVASTATIN 20 MG PO TABS: 20.0000 mg | ORAL_TABLET | Freq: Every day | ORAL | Status: DC

## 2010-06-01 MED ORDER — LEVOTHYROXINE SODIUM 175 MCG PO TABS: 175.0000 ug | ORAL_TABLET | Freq: Every day | ORAL | Status: DC

## 2010-06-01 NOTE — Patient Instructions (Signed)
Limit your sodium (Salt) intake    It is important that you exercise regularly, at least 20 minutes 3 to 4 times per week.  If you develop chest pain or shortness of breath seek  medical attention.  Return in 6 months for follow-up  Take a calcium supplement, plus 800-1200 units of vitamin D 

## 2010-06-01 NOTE — Progress Notes (Signed)
Subjective:    Patient ID: Jerome Garcia, male    DOB: 1923-05-08, 77 y.o.   MRN: [id number]  HPI  an 77 year old patient who is seen today for an annual exam. He is followed by cardiology status post pacemaker insertion for trifascicular block. He has a history of dyslipidemia carotid artery disease and hypothyroidism. He is doing quite well. No major concerns or complaints.  Preventive Screening-Counseling & Management  Alcohol-Tobacco  Alcohol drinks/day: 0  Smoking Status: quit  Caffeine-Diet-Exercise  Caffeine Counseling: not indicated; caffeine use is not excessive or problematic  Nutrition Referrals: no  Does Patient Exercise: yes  Type of exercise: walking  Exercise Counseling: to improve exercise regimen  MSH Depression Score: nonapplicable  Depression Counseling: not indicated; screening negative for depression  Allergies (verified):  No Known Drug Allergies  Past History:  Past Medical History:  Bladder CA, hx  Hyperlipidemia  Hypothyroidism  Trifascicular block, hx  Carotid artery stenosis- left carotid bruit  Benign prostatic hypertrophy  history of gastric outlet obstruction. June 2009  Past Surgical History:  s/p permanent transvenous pacemaker placement, 3/09  Hemorrhoidectomy  Orchiectomy  colonoscopy 07-2008  status post gastrojejunostomy June 2009- EGD 07-2008  Family History:  Reviewed history from 02/21/2007 and no changes required.  father died in his 49s, and throat cancer  mother died at 54 lungs cancer  One brother history of oral cancer and died of suicide death  one sister-s/p MI, dementia (24)  Social History:  Reviewed history from 02/27/2008 and no changes required.  Married  Tobacco Use - Former.  Alcohol Use - no  Does Patient Exercise: yes   Medicare wellness:   1. Risk factors based on Past M, S, F history: known coronary artery disease. He is on simvastatin for cholesterol control  2. Physical Activities: fairly active, but no  rigorous exercise regimen  3. Depression/mood: none  4. Hearing: moderately impaired. He uses hearing aids  5. ADL's: no restrictions remains fairly active  6. Fall Risk: low fall risk  7. Home Safety: reviewed no concerns  8. Height, weight, &visual acuity: visual acuity 20/25  9. Counseling: more active physical activity discussed  10. Labs ordered based on risk factors: CBC chemistries PSA lipid profile  11. Referral Coordination follow cardiology discussed  12. Care Plan- follow cardiology return office visit here for lab in one year; will schedule bone density     Review of Systems  Constitutional: Negative for fever, chills, activity change, appetite change and fatigue.  HENT: Negative for hearing loss, ear pain, congestion, rhinorrhea, sneezing, mouth sores, trouble swallowing, neck pain, neck stiffness, dental problem, voice change, sinus pressure and tinnitus.   Eyes: Negative for photophobia, pain, redness and visual disturbance.  Respiratory: Negative for apnea, cough, choking, chest tightness, shortness of breath and wheezing.   Cardiovascular: Negative for chest pain, palpitations and leg swelling.  Gastrointestinal: Negative for nausea, vomiting, abdominal pain, diarrhea, constipation, blood in stool, abdominal distention, anal bleeding and rectal pain.  Genitourinary: Negative for dysuria, urgency, frequency, hematuria, flank pain, decreased urine volume, discharge, penile swelling, scrotal swelling, difficulty urinating, genital sores and testicular pain.  Musculoskeletal: Negative for myalgias, back pain, joint swelling, arthralgias and gait problem.  Skin: Negative for color change, rash and wound.  Neurological: Negative for dizziness, tremors, seizures, syncope, facial asymmetry, speech difficulty, weakness, light-headedness, numbness and headaches.  Hematological: Negative for adenopathy. Does not bruise/bleed easily.  Psychiatric/Behavioral: Negative for suicidal  ideas, hallucinations, behavioral problems, confusion, sleep disturbance, self-injury, dysphoric mood,  decreased concentration and agitation. The patient is not nervous/anxious.        Objective:   Physical Exam  Constitutional: He appears well-developed and well-nourished.       Blood pressure 120/70 in the right; blood pressure 100/70 in the left  HENT:  Head: Normocephalic and atraumatic.  Right Ear: External ear normal.  Left Ear: External ear normal.  Nose: Nose normal.  Mouth/Throat: Oropharynx is clear and moist.  Eyes: Conjunctivae and EOM are normal. Pupils are equal, round, and reactive to light. No scleral icterus.  Neck: Normal range of motion. Neck supple. No JVD present. No thyromegaly present.  Cardiovascular: Regular rhythm and normal heart sounds.  Exam reveals no gallop and no friction rub.   No murmur heard.      Posterior tibial pulses full. Dorsalis pedis pulses not easily palpable  Pulmonary/Chest: Effort normal and breath sounds normal. He exhibits no tenderness.       Pacemaker left upper anterior chest wall area  Abdominal: Soft. Bowel sounds are normal. He exhibits no distension and no mass. There is no tenderness.  Genitourinary: Prostate normal and penis normal.       Testicular atrophy  Musculoskeletal: Normal range of motion. He exhibits no edema and no tenderness.  Lymphadenopathy:    He has no cervical adenopathy.  Neurological: He is alert. He has normal reflexes. No cranial nerve deficit. Coordination normal.  Skin: Skin is warm and dry. No rash noted.  Psychiatric: He has a normal mood and affect. His behavior is normal.          Assessment & Plan:   Annual exam  Status post pacemaker for trifascicular block Dyslipidemia. Will continue simvastatin and check a lipid profile Hypothyroidism. We'll check a TSH

## 2010-07-13 ENCOUNTER — Encounter: Payer: Self-pay | Admitting: Internal Medicine

## 2010-07-13 DIAGNOSIS — I495 Sick sinus syndrome: Secondary | ICD-10-CM

## 2010-09-28 LAB — POCT I-STAT, CHEM 8
Creatinine, Ser: 1.8 — ABNORMAL HIGH
Hemoglobin: 15.3
Sodium: 139
TCO2: 36

## 2010-09-28 LAB — GASTRIN: Gastrin: 41 pg/mL (ref 13–115)

## 2010-09-28 LAB — COMPREHENSIVE METABOLIC PANEL
ALT: 11
ALT: 29
AST: 16
AST: 45 — ABNORMAL HIGH
Alkaline Phosphatase: 40
Alkaline Phosphatase: 44
CO2: 20
CO2: 25
Calcium: 7.8 — ABNORMAL LOW
Calcium: 8.5
Chloride: 108
GFR calc Af Amer: >60
GFR calc Af Amer: >60
GFR calc non Af Amer: 59 — ABNORMAL LOW
Glucose, Bld: 103 — ABNORMAL HIGH
Glucose, Bld: 54 — ABNORMAL LOW
Potassium: 3.6
Potassium: 4.1
Sodium: 139
Sodium: 142
Total Bilirubin: 0.7
Total Protein: 4.9 — ABNORMAL LOW

## 2010-09-28 LAB — HEPATIC FUNCTION PANEL
Albumin: 3.1 — ABNORMAL LOW
Alkaline Phosphatase: 48
Total Bilirubin: 0.9

## 2010-09-28 LAB — BASIC METABOLIC PANEL
BUN: 14
BUN: 17
BUN: 36 — ABNORMAL HIGH
Calcium: 7.7 — ABNORMAL LOW
Chloride: 107
Chloride: 108
Creatinine, Ser: 1.13
Creatinine, Ser: 1.5
GFR calc Af Amer: >60
GFR calc non Af Amer: 45 — ABNORMAL LOW
GFR calc non Af Amer: 56 — ABNORMAL LOW
GFR calc non Af Amer: >60
GFR calc non Af Amer: >60
Glucose, Bld: 99
Potassium: 3.7
Potassium: 3.9
Potassium: 4
Potassium: 4.5
Sodium: 137
Sodium: 138

## 2010-09-28 LAB — CBC
HCT: 34.3 — ABNORMAL LOW
Hemoglobin: 12.3 — ABNORMAL LOW
Hemoglobin: 14.5
MCHC: 33.3
MCV: 92.1
Platelets: 157
RBC: 3.72 — ABNORMAL LOW
RBC: 3.96 — ABNORMAL LOW
RBC: 4.75
RDW: 13.6
WBC: 10.9 — ABNORMAL HIGH
WBC: 11.7 — ABNORMAL HIGH
WBC: 6.2
WBC: 7.8

## 2010-09-28 LAB — DIFFERENTIAL
Basophils Absolute: 0
Basophils Relative: 1
Eosinophils Absolute: 0
Eosinophils Absolute: 0.2
Eosinophils Relative: 0
Eosinophils Relative: 3
Lymphocytes Relative: 14
Lymphs Abs: 2.3
Neutro Abs: 8.5 — ABNORMAL HIGH
Neutrophils Relative %: 47
Neutrophils Relative %: 78 — ABNORMAL HIGH

## 2010-09-28 LAB — URINE MICROSCOPIC-ADD ON

## 2010-09-28 LAB — MAGNESIUM
Magnesium: 1.9
Magnesium: 2

## 2010-09-28 LAB — URINALYSIS, ROUTINE W REFLEX MICROSCOPIC
Glucose, UA: NEGATIVE
Hgb urine dipstick: NEGATIVE
Protein, ur: 100 — AB
pH: 7.5

## 2010-09-28 LAB — PHOSPHORUS
Phosphorus: 1.8 — ABNORMAL LOW
Phosphorus: 3

## 2010-09-28 LAB — TRIGLYCERIDES: Triglycerides: 70

## 2010-09-28 LAB — AMYLASE: Amylase: 90

## 2010-09-28 LAB — CK TOTAL AND CKMB (NOT AT ARMC): Relative Index: INVALID

## 2010-09-28 LAB — CHOLESTEROL, TOTAL: Cholesterol: 91

## 2010-09-28 LAB — HEMOGLOBIN A1C: Hgb A1c MFr Bld: 5.6

## 2010-09-29 LAB — CBC
HCT: 29.9 — ABNORMAL LOW
HCT: 30.5 — ABNORMAL LOW
HCT: 32.3 — ABNORMAL LOW
HCT: 34.8 — ABNORMAL LOW
Hemoglobin: 10.1 — ABNORMAL LOW
Hemoglobin: 10.3 — ABNORMAL LOW
Hemoglobin: 11.7 — ABNORMAL LOW
Hemoglobin: 12.5 — ABNORMAL LOW
Hemoglobin: 13.4
MCHC: 33.5
MCHC: 34.1
MCV: 90.6
MCV: 91.3
MCV: 91.6
MCV: 91.6
MCV: 92
Platelets: 171
RBC: 3.27 — ABNORMAL LOW
RBC: 3.32 — ABNORMAL LOW
RBC: 3.53 — ABNORMAL LOW
RBC: 4.04 — ABNORMAL LOW
RBC: 4.29
RDW: 13.3
RDW: 13.4
RDW: 13.6
RDW: 13.6
WBC: 10.3
WBC: 12.7 — ABNORMAL HIGH
WBC: 6.2
WBC: 7.7

## 2010-09-29 LAB — TRIGLYCERIDES
Triglycerides: 40
Triglycerides: 65

## 2010-09-29 LAB — BASIC METABOLIC PANEL
BUN: 23
CO2: 27
Chloride: 106
Creatinine, Ser: 1.05
GFR calc Af Amer: >60
Glucose, Bld: 115 — ABNORMAL HIGH

## 2010-09-29 LAB — COMPREHENSIVE METABOLIC PANEL
ALT: 20
AST: 30
Albumin: 2 — ABNORMAL LOW
Albumin: 2.1 — ABNORMAL LOW
Alkaline Phosphatase: 49
BUN: 21
BUN: 22
BUN: 29 — ABNORMAL HIGH
BUN: 33 — ABNORMAL HIGH
CO2: 24
Calcium: 8.2 — ABNORMAL LOW
Calcium: 8.3 — ABNORMAL LOW
Chloride: 106
Chloride: 108
Chloride: 111
Creatinine, Ser: 1
Creatinine, Ser: 1.18
Creatinine, Ser: 1.19
GFR calc Af Amer: >60
GFR calc non Af Amer: 59 — ABNORMAL LOW
Glucose, Bld: 118 — ABNORMAL HIGH
Glucose, Bld: 124 — ABNORMAL HIGH
Glucose, Bld: 129 — ABNORMAL HIGH
Potassium: 3.6
Sodium: 137
Total Bilirubin: 0.6
Total Bilirubin: 0.8
Total Bilirubin: 0.9
Total Protein: 5.2 — ABNORMAL LOW
Total Protein: 5.3 — ABNORMAL LOW

## 2010-09-29 LAB — URINE CULTURE
Colony Count: NO GROWTH
Colony Count: NO GROWTH
Culture: NO GROWTH
Culture: NO GROWTH

## 2010-09-29 LAB — CLOSTRIDIUM DIFFICILE EIA

## 2010-09-29 LAB — URINALYSIS, ROUTINE W REFLEX MICROSCOPIC
Bilirubin Urine: NEGATIVE
Hgb urine dipstick: NEGATIVE
Hgb urine dipstick: NEGATIVE
Ketones, ur: NEGATIVE
Nitrite: NEGATIVE
Protein, ur: 30 — AB
Protein, ur: NEGATIVE
Urobilinogen, UA: 0.2
Urobilinogen, UA: 1

## 2010-09-29 LAB — URINE MICROSCOPIC-ADD ON

## 2010-09-29 LAB — DIFFERENTIAL
Basophils Absolute: 0
Basophils Absolute: 0
Basophils Absolute: 0
Basophils Absolute: 0.1
Basophils Relative: 0
Basophils Relative: 0
Eosinophils Absolute: 0
Eosinophils Absolute: 0.1
Eosinophils Absolute: 0.5
Eosinophils Relative: 1
Lymphocytes Relative: 10 — ABNORMAL LOW
Lymphocytes Relative: 11 — ABNORMAL LOW
Lymphocytes Relative: 8 — ABNORMAL LOW
Lymphs Abs: 1.1
Lymphs Abs: 1.3
Monocytes Absolute: 0.3
Monocytes Absolute: 0.7
Monocytes Absolute: 0.8
Monocytes Absolute: 0.8
Monocytes Relative: 5
Monocytes Relative: 6
Neutro Abs: 10.6 — ABNORMAL HIGH
Neutro Abs: 5.5
Neutro Abs: 7.8 — ABNORMAL HIGH

## 2010-09-29 LAB — CULTURE, BLOOD (ROUTINE X 2)

## 2010-09-29 LAB — PHOSPHORUS
Phosphorus: 2.8
Phosphorus: 3.1
Phosphorus: 3.6

## 2010-09-29 LAB — MAGNESIUM: Magnesium: 1.9

## 2010-10-11 ENCOUNTER — Encounter: Payer: Self-pay | Admitting: *Deleted

## 2010-10-12 ENCOUNTER — Encounter: Payer: Self-pay | Admitting: Internal Medicine

## 2010-10-12 ENCOUNTER — Ambulatory Visit (INDEPENDENT_AMBULATORY_CARE_PROVIDER_SITE_OTHER): Payer: Medicare Other | Admitting: Internal Medicine

## 2010-10-12 VITALS — BP 126/74 | HR 60 | Ht 70.0 in | Wt 153.0 lb

## 2010-10-12 DIAGNOSIS — Z950 Presence of cardiac pacemaker: Secondary | ICD-10-CM | POA: Insufficient documentation

## 2010-10-12 DIAGNOSIS — I495 Sick sinus syndrome: Secondary | ICD-10-CM

## 2010-10-12 DIAGNOSIS — I453 Trifascicular block: Secondary | ICD-10-CM

## 2010-10-12 DIAGNOSIS — I4891 Unspecified atrial fibrillation: Secondary | ICD-10-CM | POA: Insufficient documentation

## 2010-10-12 DIAGNOSIS — Z8669 Personal history of other diseases of the nervous system and sense organs: Secondary | ICD-10-CM

## 2010-10-12 NOTE — Assessment & Plan Note (Signed)
Intermittent ventricular pacing

## 2010-10-12 NOTE — Assessment & Plan Note (Signed)
The patient's device was interrogated.  The information was reviewed. No changes were made in the programming.    

## 2010-10-12 NOTE — Progress Notes (Signed)
  HPI  Jerome Garcia is a 77 y.o. male seen in followup for a [employer] pacemaker implanted for syncope and trifascicular block.   He has no specific complaints of dizziness. He denies shortness of breath chest pain or peripheral edema.  Past Medical History  Diagnosis Date  . Hx of bladder cancer   . HLD (hyperlipidemia)   . Trifascicular block     history  . Hypothyroidism   . Carotid artery stenosis     left carotid bruit  . Benign prostatic hypertrophy   . Gastric outlet obstruction 06/2007    history  . Pacemaker     Past Surgical History  Procedure Date  . Pacemaker insertion 03/2007    permanent transvenous, [employer]  . Hemorroidectomy   . Orchiectomy   . Gastrojejunostomy 06/2007  . Esophagogastroduodenoscopy 07/2008    Current Outpatient Prescriptions  Medication Sig Dispense Refill  . aspirin 81 MG tablet Take 81 mg by mouth daily.        . Calcium Citrate-Vitamin D (CITRACAL + D PO) Take 1 tablet by mouth daily.        Marland Kitchen levothyroxine (SYNTHROID, LEVOTHROID) 175 MCG tablet Take 1 tablet (175 mcg total) by mouth daily.  90 tablet  6  . Multiple Vitamin (MULTIVITAMIN) tablet Take 1 tablet by mouth daily.        . Multiple Vitamins-Minerals (ICAPS PO) Take by mouth daily.        . simvastatin (ZOCOR) 20 MG tablet Take 1 tablet (20 mg total) by mouth at bedtime.  90 tablet  6  . vitamin E 400 UNIT capsule Take 400 Units by mouth daily.          No Known Allergies  Review of Systems negative except from HPI and PMH  Physical Exam Well developed and well nourished in no acute distress HENT normal E scleral and icterus clear Neck Supple JVP flat; carotids brisk and full Clear to ausculation Regular rate and rhythm but they are quite distant Soft with active bowel sounds No clubbing cyanosis and edema Alert and oriented, grossly normal motor and sensory function Skin Warm and Dry    Assessment and  Plan

## 2010-10-12 NOTE — Assessment & Plan Note (Signed)
No recurrent syncope 

## 2010-10-12 NOTE — Assessment & Plan Note (Signed)
Atrial fibrillation burden I don't think is sufficiently great at this point to justify anticoagulation. We will follow it. He is on aspirin; perhaps with the release of apixoban it would be reasonable transition given its comparable risk to aspirin

## 2010-12-04 ENCOUNTER — Ambulatory Visit: Payer: Medicare Other | Admitting: Internal Medicine

## 2010-12-05 ENCOUNTER — Encounter: Payer: Self-pay | Admitting: Internal Medicine

## 2010-12-05 ENCOUNTER — Ambulatory Visit (INDEPENDENT_AMBULATORY_CARE_PROVIDER_SITE_OTHER): Payer: Medicare Other | Admitting: Internal Medicine

## 2010-12-05 DIAGNOSIS — E039 Hypothyroidism, unspecified: Secondary | ICD-10-CM

## 2010-12-05 DIAGNOSIS — I4891 Unspecified atrial fibrillation: Secondary | ICD-10-CM

## 2010-12-05 DIAGNOSIS — E785 Hyperlipidemia, unspecified: Secondary | ICD-10-CM

## 2010-12-05 DIAGNOSIS — I6529 Occlusion and stenosis of unspecified carotid artery: Secondary | ICD-10-CM

## 2010-12-05 DIAGNOSIS — Z950 Presence of cardiac pacemaker: Secondary | ICD-10-CM

## 2010-12-05 NOTE — Patient Instructions (Signed)
Limit your sodium (Salt) intake    It is important that you exercise regularly, at least 20 minutes 3 to 4 times per week.  If you develop chest pain or shortness of breath seek  medical attention.  Return in 6 months for follow-up  

## 2010-12-05 NOTE — Progress Notes (Signed)
  Subjective:    Patient ID: Jerome Garcia, male    DOB: [birth year], 77 y.o.   MRN: [id number]  HPI  77 year old patient who is seen today for his biannual followup. He was seen by cardiology 2 months ago. He has a history of syncope secondary to trifascicular block and is now status post Education officer, community pacemaker. He does have a history of rare paroxysmal atrial fibrillation but is not felt to be a candidate for chronic anticoagulation. He remains on aspirin 81 mg daily he denies any palpitations. He has a history of a left carotid bruit which has been stable. He denies any focal neurological symptoms. He has dyslipidemia and is on simvastatin 20 mg daily He is treated hypothyroidism He is asking about a shingles vaccine    Review of Systems  Constitutional: Negative for fever, chills, appetite change and fatigue.  HENT: Negative for hearing loss, ear pain, congestion, sore throat, trouble swallowing, neck stiffness, dental problem, voice change and tinnitus.   Eyes: Negative for pain, discharge and visual disturbance.  Respiratory: Negative for cough, chest tightness, wheezing and stridor.   Cardiovascular: Negative for chest pain, palpitations and leg swelling.  Gastrointestinal: Negative for nausea, vomiting, abdominal pain, diarrhea, constipation, blood in stool and abdominal distention.  Genitourinary: Negative for urgency, hematuria, flank pain, discharge, difficulty urinating and genital sores.  Musculoskeletal: Negative for myalgias, back pain, joint swelling, arthralgias and gait problem.  Skin: Negative for rash.  Neurological: Negative for dizziness, syncope, speech difficulty, weakness, numbness and headaches.  Hematological: Negative for adenopathy. Does not bruise/bleed easily.  Psychiatric/Behavioral: Negative for behavioral problems and dysphoric mood. The patient is not nervous/anxious.        Objective:   Physical Exam  Constitutional: He is oriented to  person, place, and time. He appears well-developed.  HENT:  Head: Normocephalic.  Right Ear: External ear normal.  Left Ear: External ear normal.  Eyes: Conjunctivae and EOM are normal.  Neck: Normal range of motion.       Left carotid bruit  Cardiovascular: Normal rate, regular rhythm and normal heart sounds.        Pacemaker left upper anterior chest wall  Pulmonary/Chest: Effort normal and breath sounds normal.  Abdominal: Bowel sounds are normal.  Musculoskeletal: Normal range of motion. He exhibits no edema and no tenderness.  Neurological: He is alert and oriented to person, place, and time.  Psychiatric: He has a normal mood and affect. His behavior is normal.          Assessment & Plan:   Status post pacemaker for complete heart block Carotid artery disease asymptomatic Dyslipidemia Hypothyroidism  We'll recheck in 6 months at the time of his annual preventive examination laboratory update at that time. Medical regimen unchanged

## 2011-01-11 ENCOUNTER — Encounter: Payer: Self-pay | Admitting: Internal Medicine

## 2011-01-11 DIAGNOSIS — I495 Sick sinus syndrome: Secondary | ICD-10-CM

## 2011-04-04 ENCOUNTER — Ambulatory Visit (INDEPENDENT_AMBULATORY_CARE_PROVIDER_SITE_OTHER): Payer: Medicare Other | Admitting: *Deleted

## 2011-04-04 DIAGNOSIS — I6529 Occlusion and stenosis of unspecified carotid artery: Secondary | ICD-10-CM

## 2011-04-11 ENCOUNTER — Other Ambulatory Visit: Payer: Self-pay | Admitting: *Deleted

## 2011-04-11 DIAGNOSIS — I6529 Occlusion and stenosis of unspecified carotid artery: Secondary | ICD-10-CM

## 2011-04-11 NOTE — Procedures (Unsigned)
CAROTID DUPLEX EXAM  INDICATION:  Carotid disease.  HISTORY: Diabetes:  No Cardiac:  CABG Hypertension:  Yes Smoking:  Previous Previous Surgery:  History of AAA repair CV History:  Currently asymptomatic Amaurosis Fugax No, Paresthesias No, Hemiparesis No                                      RIGHT             LEFT Brachial systolic pressure: Brachial Doppler waveforms: Vertebral direction of flow:                          Antegrade DUPLEX VELOCITIES (cm/sec) CCA peak systolic                                     50 ECA peak systolic                                     161 ICA peak systolic                                     211 ICA end diastolic                                     78 PLAQUE MORPHOLOGY:                                    Heterogeneous PLAQUE AMOUNT:                                        Moderate PLAQUE LOCATION:                                      ICA, bifurcation  IMPRESSION: 1. Right internal carotid artery known occlusion. 2. Left internal carotid artery velocities suggest 60% to 79%     stenosis. 3. Left external carotid artery stenosis.  ___________________________________________ Larina Earthly, M.D.  EM/MEDQ  D:  04/05/2011  T:  04/05/2011  Job:  [id number]

## 2011-04-12 ENCOUNTER — Encounter: Payer: Self-pay | Admitting: Internal Medicine

## 2011-04-12 ENCOUNTER — Encounter: Payer: Self-pay | Admitting: Vascular Surgery

## 2011-04-12 DIAGNOSIS — I495 Sick sinus syndrome: Secondary | ICD-10-CM

## 2011-06-05 ENCOUNTER — Encounter: Payer: Self-pay | Admitting: Internal Medicine

## 2011-06-05 ENCOUNTER — Ambulatory Visit (INDEPENDENT_AMBULATORY_CARE_PROVIDER_SITE_OTHER): Payer: Medicare Other | Admitting: Internal Medicine

## 2011-06-05 VITALS — BP 112/70 | HR 62 | Temp 97.4°F | Resp 16 | Ht 69.5 in | Wt 144.0 lb

## 2011-06-05 DIAGNOSIS — C679 Malignant neoplasm of bladder, unspecified: Secondary | ICD-10-CM

## 2011-06-05 DIAGNOSIS — Z0000 Encounter for general adult medical examination without abnormal findings: Secondary | ICD-10-CM

## 2011-06-05 DIAGNOSIS — E039 Hypothyroidism, unspecified: Secondary | ICD-10-CM

## 2011-06-05 DIAGNOSIS — I4891 Unspecified atrial fibrillation: Secondary | ICD-10-CM

## 2011-06-05 DIAGNOSIS — I6529 Occlusion and stenosis of unspecified carotid artery: Secondary | ICD-10-CM

## 2011-06-05 DIAGNOSIS — E785 Hyperlipidemia, unspecified: Secondary | ICD-10-CM

## 2011-06-05 DIAGNOSIS — Z950 Presence of cardiac pacemaker: Secondary | ICD-10-CM

## 2011-06-05 LAB — COMPREHENSIVE METABOLIC PANEL
ALT: 15 U/L (ref 0–53)
CO2: 30 mEq/L (ref 19–32)
Creatinine, Ser: 1.4 mg/dL (ref 0.4–1.5)
GFR: 51.45 mL/min — ABNORMAL LOW (ref >60.00)
Glucose, Bld: 87 mg/dL (ref 70–99)
Total Bilirubin: 0.6 mg/dL (ref 0.3–1.2)

## 2011-06-05 LAB — LIPID PANEL
HDL: 55.6 mg/dL (ref >39.00)
Total CHOL/HDL Ratio: 2
Triglycerides: 67 mg/dL (ref 0.0–149.0)

## 2011-06-05 LAB — CBC WITH DIFFERENTIAL/PLATELET
Eosinophils Relative: 2.4 % (ref 0.0–5.0)
HCT: 44.2 % (ref 39.0–52.0)
Hemoglobin: 14.6 g/dL (ref 13.0–17.0)
Lymphs Abs: 1.5 10*3/uL (ref 0.7–4.0)
Monocytes Relative: 10.1 % (ref 3.0–12.0)
Neutro Abs: 3.2 10*3/uL (ref 1.4–7.7)
WBC: 5.4 10*3/uL (ref 4.5–10.5)

## 2011-06-05 MED ORDER — SIMVASTATIN 20 MG PO TABS: 20.0000 mg | ORAL_TABLET | Freq: Every day | ORAL | Status: DC

## 2011-06-05 MED ORDER — LEVOTHYROXINE SODIUM 175 MCG PO TABS: 175.0000 ug | ORAL_TABLET | Freq: Every day | ORAL | Status: DC

## 2011-06-05 NOTE — Progress Notes (Signed)
Subjective:    Patient ID: Jerome Garcia, male    DOB: 31-Oct-1923, 78 y.o.   MRN: [id number]  HPI  78 year old patient who is seen today for a wellness exam. 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 He was noted to have bruxism will atrial fibrillation noted on pacemaker tracing. He has a history of bladder and prostate cancer. He does quite well at 88 without major concerns or complaints.  Preventive Screening-Counseling & Management  Alcohol-Tobacco  Alcohol drinks/day: 0  Smoking Status: quit  Caffeine-Diet-Exercise  Caffeine Counseling: not indicated; caffeine use is not excessive or problematic  Nutrition Referrals: no  Does Patient Exercise: yes  Type of exercise: walking  Exercise Counseling: to improve exercise regimen  MSH Depression Score: nonapplicable  Depression Counseling: not indicated; screening negative for depression   Allergies (verified):  No Known Drug Allergies   Past History:  Past Medical History:  Bladder CA, hx  Hyperlipidemia  Hypothyroidism  Trifascicular block, hx  Carotid artery stenosis- left carotid bruit  Benign prostatic hypertrophy  history of gastric outlet obstruction. June 2009   Past Surgical History:  s/p permanent transvenous pacemaker placement, 3/09  Hemorrhoidectomy  Orchiectomy  colonoscopy 07-2008  status post gastrojejunostomy June 2009- EGD 07-2008   Family History:  Reviewed history from 02/21/2007 and no changes required.  father died in his 45s, and throat cancer  mother died at 81 lungs cancer  One brother history of oral cancer and died of suicide death  one sister-s/p MI, dementia (38)   Social History:  Reviewed history from 02/27/2008 and no changes required.  Married  Tobacco Use - Former.  Alcohol Use - no  Does Patient Exercise: yes   Medicare wellness:  1. Risk factors based on Past M, S, F history: known coronary artery disease. He is on  simvastatin for cholesterol control  2. Physical Activities: fairly active, but no rigorous exercise regimen  3. Depression/mood: none  4. Hearing: moderately impaired. He uses hearing aids  5. ADL's: no restrictions remains fairly active  6. Fall Risk: low fall risk  7. Home Safety: reviewed no concerns  8. Height, weight, &visual acuity: visual acuity 20/25  9. Counseling: more active physical activity discussed  10. Labs ordered based on risk factors: CBC chemistries PSA lipid profile  11. Referral Coordination follow cardiology discussed  12. Care Plan- follow cardiology return office visit here for lab in one year; will schedule bone density   Past Medical History  Diagnosis Date  . Hx of bladder cancer   . HLD (hyperlipidemia)   . Trifascicular block     history  . Hypothyroidism   . Carotid artery stenosis     left carotid bruit  . Benign prostatic hypertrophy   . Gastric outlet obstruction 06/2007    history  . Pacemaker     History   Social History  . Marital Status: Married    Spouse Name: N/A    Number of Children: N/A  . Years of Education: N/A   Occupational History  . Not on file.   Social History Main Topics  . Smoking status: Former Smoker    Quit date: 01/01/1978  . Smokeless tobacco: Never Used  . Alcohol Use: No  . Drug Use: No  . Sexually Active: Not on file   Other Topics Concern  . Not on file   Social History Narrative  . No narrative on file  Past Surgical History  Procedure Date  . Pacemaker insertion 03/2007    permanent transvenous, [employer]  . Hemorroidectomy   . Orchiectomy   . Gastrojejunostomy 06/2007  . Esophagogastroduodenoscopy 07/2008    Family History  Problem Relation Age of Onset  . Throat cancer Father     in his 37's  . Lung cancer Mother   . Other Brother     history of oral cancer  . Heart attack Sister   . Dementia Sister     No Known Allergies  Current Outpatient Prescriptions on File Prior  to Visit  Medication Sig Dispense Refill  . aspirin 81 MG tablet Take 81 mg by mouth daily.        . Calcium Citrate-Vitamin D (CITRACAL + D PO) Take 1 tablet by mouth daily.        Marland Kitchen levothyroxine (SYNTHROID, LEVOTHROID) 175 MCG tablet Take 1 tablet (175 mcg total) by mouth daily.  90 tablet  6  . Multiple Vitamin (MULTIVITAMIN) tablet Take 1 tablet by mouth daily.        . Multiple Vitamins-Minerals (ICAPS PO) Take by mouth daily.        . simvastatin (ZOCOR) 20 MG tablet Take 1 tablet (20 mg total) by mouth at bedtime.  90 tablet  6  . vitamin E 400 UNIT capsule Take 400 Units by mouth daily.          BP 112/70  Pulse 62  Temp(Src) 97.4 F (36.3 C) (Oral)  Resp 16  Ht 5' 9.5" (1.765 m)  Wt 144 lb (65.318 kg)  BMI 20.96 kg/m2  SpO2 98%         Review of Systems  Constitutional: Negative for fever, chills, activity change, appetite change and fatigue.  HENT: Negative for hearing loss, ear pain, congestion, rhinorrhea, sneezing, mouth sores, trouble swallowing, neck pain, neck stiffness, dental problem, voice change, sinus pressure and tinnitus.   Eyes: Negative for photophobia, pain, redness and visual disturbance.  Respiratory: Negative for apnea, cough, choking, chest tightness, shortness of breath and wheezing.   Cardiovascular: Negative for chest pain, palpitations and leg swelling.  Gastrointestinal: Negative for nausea, vomiting, abdominal pain, diarrhea, constipation, blood in stool, abdominal distention, anal bleeding and rectal pain.  Genitourinary: Positive for frequency. Negative for dysuria, urgency, hematuria, flank pain, decreased urine volume, discharge, penile swelling, scrotal swelling, difficulty urinating, genital sores and testicular pain.  Musculoskeletal: Negative for myalgias, back pain, joint swelling, arthralgias and gait problem.  Skin: Negative for color change, rash and wound.  Neurological: Negative for dizziness, tremors, seizures, syncope, facial  asymmetry, speech difficulty, weakness, light-headedness, numbness and headaches.  Hematological: Negative for adenopathy. Does not bruise/bleed easily.  Psychiatric/Behavioral: Positive for decreased concentration. Negative for suicidal ideas, hallucinations, behavioral problems, confusion, sleep disturbance, self-injury, dysphoric mood and agitation. The patient is not nervous/anxious.        Objective:   Physical Exam  Constitutional: He appears well-developed and well-nourished.  HENT:  Head: Normocephalic and atraumatic.  Right Ear: External ear normal.  Left Ear: External ear normal.  Nose: Nose normal.  Mouth/Throat: Oropharynx is clear and moist.       Hearing aids in place bilaterally  Eyes: Conjunctivae and EOM are normal. Pupils are equal, round, and reactive to light. No scleral icterus.  Neck: Normal range of motion. Neck supple. No JVD present. No thyromegaly present.       Decreased right carotid upstroke  Cardiovascular: Regular rhythm, normal heart sounds and intact distal  pulses.  Exam reveals no gallop and no friction rub.   No murmur heard.      Posterior tibial pulses are full. Dorsalis pedis pulses are not easily palpable  Pulmonary/Chest: Effort normal and breath sounds normal. He exhibits no tenderness.  Abdominal: Soft. Bowel sounds are normal. He exhibits no distension and no mass. There is no tenderness.       Prominent aortic pulsation  Genitourinary: Prostate normal and penis normal.       Bilateral orchiectomy  Musculoskeletal: Normal range of motion. He exhibits no edema and no tenderness.  Lymphadenopathy:    He has no cervical adenopathy.  Neurological: He is alert. He has normal reflexes. No cranial nerve deficit. Coordination normal.       Decreased vibratory sensation distally  Skin: Skin is warm and dry. No rash noted.  Psychiatric: He has a normal mood and affect. His behavior is normal.          Assessment & Plan:   Preventive health  examination Status post pacemaker for trifascicular block Dyslipidemia. Continue simvastatin 20 mg daily Hypothyroidism. We'll check a TSH  Recheck 6 months

## 2011-06-05 NOTE — Patient Instructions (Signed)
Limit your sodium (Salt) intake    It is important that you exercise regularly, at least 20 minutes 3 to 4 times per week.  If you develop chest pain or shortness of breath seek  medical attention.  Return in 6 months for follow-up  

## 2011-07-12 DIAGNOSIS — I495 Sick sinus syndrome: Secondary | ICD-10-CM

## 2011-10-10 ENCOUNTER — Encounter: Payer: Self-pay | Admitting: Neurosurgery

## 2011-10-11 ENCOUNTER — Ambulatory Visit (INDEPENDENT_AMBULATORY_CARE_PROVIDER_SITE_OTHER): Payer: Medicare Other | Admitting: Neurosurgery

## 2011-10-11 ENCOUNTER — Other Ambulatory Visit (INDEPENDENT_AMBULATORY_CARE_PROVIDER_SITE_OTHER): Payer: Medicare Other | Admitting: *Deleted

## 2011-10-11 ENCOUNTER — Encounter: Payer: Self-pay | Admitting: Neurosurgery

## 2011-10-11 VITALS — BP 122/81 | HR 60 | Resp 20 | Ht 71.0 in | Wt 140.0 lb

## 2011-10-11 DIAGNOSIS — I6529 Occlusion and stenosis of unspecified carotid artery: Secondary | ICD-10-CM

## 2011-10-11 DIAGNOSIS — Z48812 Encounter for surgical aftercare following surgery on the circulatory system: Secondary | ICD-10-CM

## 2011-10-11 DIAGNOSIS — I495 Sick sinus syndrome: Secondary | ICD-10-CM

## 2011-10-11 NOTE — Addendum Note (Signed)
Addended by: Sharee Pimple on: 10/11/2011 12:42 PM   Modules accepted: Orders

## 2011-10-11 NOTE — Progress Notes (Signed)
[facility] Carotid Office Note  CC: Six-month carotid surveillance Referring Physician: Early  History of Present Illness: 78 year old male patient of Dr. Arbie Cookey followed for known right ICA occlusion with advanced left ICA stenosis. The patient denies any signs or symptoms of CVA, TIA, amaurosis fugax or any neural deficit.  Past Medical History  Diagnosis Date  . Hx of bladder cancer   . HLD (hyperlipidemia)   . Trifascicular block     history  . Hypothyroidism   . Carotid artery stenosis     left carotid bruit  . Benign prostatic hypertrophy   . Gastric outlet obstruction 06/2007    history  . Pacemaker     ROS: [x]  Positive   [ ]  Denies    General: [ ]  Weight loss, [ ]  Fever, [ ]  chills Neurologic: [ ]  Dizziness, [ ]  Blackouts, [ ]  Seizure [ ]  Stroke, [ ]  "Mini stroke", [ ]  Slurred speech, [ ]  Temporary blindness; [ ]  weakness in arms or legs, [ ]  Hoarseness Cardiac: [ ]  Chest pain/pressure, [ ]  Shortness of breath at rest [ ]  Shortness of breath with exertion, [ ]  Atrial fibrillation or irregular heartbeat Vascular: [ ]  Pain in legs with walking, [ ]  Pain in legs at rest, [ ]  Pain in legs at night,  [ ]  Non-healing ulcer, [ ]  Blood clot in vein/DVT,   Pulmonary: [ ]  Home oxygen, [ ]  Productive cough, [ ]  Coughing up blood, [ ]  Asthma,  [ ]  Wheezing Musculoskeletal:  [ ]  Arthritis, [ ]  Low back pain, [ ]  Joint pain Hematologic: [ ]  Easy Bruising, [ ]  Anemia; [ ]  Hepatitis Gastrointestinal: [ ]  Blood in stool, [ ]  Gastroesophageal Reflux/heartburn, [ ]  Trouble swallowing Urinary: [ ]  chronic Kidney disease, [ ]  on HD - [ ]  MWF or [ ]  TTHS, [ ]  Burning with urination, [ ]  Difficulty urinating Skin: [ ]  Rashes, [ ]  Wounds Psychological: [ ]  Anxiety, [ ]  Depression   Social History History  Substance Use Topics  . Smoking status: Former Smoker -- 30 years    Types: Cigarettes    Quit date: 01/01/1978  . Smokeless tobacco: Never Used  .  Alcohol Use: No    Family History Family History  Problem Relation Age of Onset  . Throat cancer Father     in his 26's  . Lung cancer Mother   . Other Brother     history of oral cancer  . Heart attack Sister   . Dementia Sister     No Known Allergies  Current Outpatient Prescriptions  Medication Sig Dispense Refill  . aspirin 81 MG tablet Take 81 mg by mouth daily.        . Calcium Citrate-Vitamin D (CITRACAL + D PO) Take 1 tablet by mouth daily.        Marland Kitchen levothyroxine (SYNTHROID, LEVOTHROID) 175 MCG tablet Take 1 tablet (175 mcg total) by mouth daily.  90 tablet  6  . Multiple Vitamin (MULTIVITAMIN) tablet Take 1 tablet by mouth daily.        . Multiple Vitamins-Minerals (ICAPS PO) Take by mouth daily.        . simvastatin (ZOCOR) 20 MG tablet Take 1 tablet (20 mg total) by mouth at bedtime.  90 tablet  6  . vitamin E 400 UNIT capsule Take 400 Units by mouth daily.          Physical Examination  Filed Vitals:   10/11/11 1114  BP: 122/81  Pulse: 60  Resp: 20    Body mass index is 19.53 kg/(m^2).  General:  WDWN in NAD Gait: Normal HEENT: WNL Eyes: Pupils equal Pulmonary: normal non-labored breathing , without Rales, rhonchi,  wheezing Cardiac: RRR, without  Murmurs, rubs or gallops; Abdomen: soft, NT, no masses Skin: no rashes, ulcers noted  Vascular Exam Pulses: 2+ radial pulses bilaterally Carotid bruits: Carotid bruit heard on the left, right occlusion Extremities without ischemic changes, no Gangrene , no cellulitis; no open wounds;  Musculoskeletal: no muscle wasting or atrophy   Neurologic: A&O X 3; Appropriate Affect ; SENSATION: normal; MOTOR FUNCTION:  moving all extremities equally. Speech is fluent/normal  Non-Invasive Vascular Imaging CAROTID DUPLEX 10/11/2011  Right ICA 0ccluded stenosis Left ICA 60 - 79 % stenosis   ASSESSMENT/PLAN: Asymptomatic patient with advanced left ICA stenosis and right ICA occlusion, the patient understands the  signs and symptoms of CVA and knows to report to the nearest emergency department should that occur. The patient will followup in 6 months with repeat carotid duplex. The patient's questions were encouraged and answered, he is in agreement with this plan.  Lauree Chandler ANP   Clinic MD: Darrick Penna

## 2011-10-17 ENCOUNTER — Encounter: Payer: Self-pay | Admitting: Internal Medicine

## 2011-10-17 ENCOUNTER — Ambulatory Visit (INDEPENDENT_AMBULATORY_CARE_PROVIDER_SITE_OTHER): Payer: Medicare Other | Admitting: Internal Medicine

## 2011-10-17 VITALS — BP 153/86 | HR 60 | Ht 71.0 in | Wt 144.8 lb

## 2011-10-17 DIAGNOSIS — I495 Sick sinus syndrome: Secondary | ICD-10-CM

## 2011-10-17 DIAGNOSIS — I440 Atrioventricular block, first degree: Secondary | ICD-10-CM | POA: Insufficient documentation

## 2011-10-17 DIAGNOSIS — I453 Trifascicular block: Secondary | ICD-10-CM

## 2011-10-17 DIAGNOSIS — R1900 Intra-abdominal and pelvic swelling, mass and lump, unspecified site: Secondary | ICD-10-CM

## 2011-10-17 DIAGNOSIS — Z950 Presence of cardiac pacemaker: Secondary | ICD-10-CM

## 2011-10-17 DIAGNOSIS — I4891 Unspecified atrial fibrillation: Secondary | ICD-10-CM

## 2011-10-17 LAB — PACEMAKER DEVICE OBSERVATION
AL THRESHOLD: 0.7 V
BAMS-0001: 170 {beats}/min
BAMS-0002: 8 ms
DEVICE MODEL PM: 316920
RV LEAD AMPLITUDE: 12 mv

## 2011-10-17 NOTE — Assessment & Plan Note (Signed)
There is significant first degree AV block. I wonder whether this may not be augmenting his exercise intolerance and so I have reprogrammed his AV delay from 300-200 ms

## 2011-10-17 NOTE — Assessment & Plan Note (Signed)
No significant atrial fibrillation 

## 2011-10-17 NOTE — Assessment & Plan Note (Signed)
Stable post pacer 

## 2011-10-17 NOTE — Assessment & Plan Note (Signed)
He has a history of vascular disease. We'll check an abdominal ultrasound

## 2011-10-17 NOTE — Assessment & Plan Note (Signed)
This may be contributing to his exercise intolerance. I have raised the base rate from 60-65 I have changed the upper tracking rate from 110-130and modulated the rate response.

## 2011-10-17 NOTE — Progress Notes (Signed)
Patient Care Team: Gordy Savers, MD as PCP - General Lewayne Bunting, MD (Cardiology)   HPI  Jerome Garcia is a 78 y.o. male seen in followup for a [facility] [employer] pacemaker implanted for syncope and trifascicular block.  He has no specific complaints of dizziness. He denies chest pain peripheral edema.  He does however have exercise intolerance and when he walks he gives out quickly.  There is no family history of aneurysmal disease.  His wife is concerned about memory loss.  Past Medical History  Diagnosis Date  . Hx of bladder cancer   . HLD (hyperlipidemia)   . Trifascicular block     history  . Hypothyroidism   . Carotid artery stenosis     left carotid bruit  . Benign prostatic hypertrophy   . Gastric outlet obstruction 06/2007    history  . Pacemaker     Past Surgical History  Procedure Date  . Pacemaker insertion 03/2007    permanent transvenous, [employer]  . Hemorroidectomy   . Orchiectomy   . Gastrojejunostomy 06/2007  . Esophagogastroduodenoscopy 07/2008    Current Outpatient Prescriptions  Medication Sig Dispense Refill  . aspirin 81 MG tablet Take 81 mg by mouth daily.        . Calcium Citrate-Vitamin D (CITRACAL + D PO) Take 1 tablet by mouth daily.        Marland Kitchen levothyroxine (SYNTHROID, LEVOTHROID) 175 MCG tablet Take 1 tablet (175 mcg total) by mouth daily.  90 tablet  6  . Multiple Vitamin (MULTIVITAMIN) tablet Take 1 tablet by mouth daily.        . simvastatin (ZOCOR) 20 MG tablet Take 1 tablet (20 mg total) by mouth at bedtime.  90 tablet  6  . vitamin E 400 UNIT capsule Take 400 Units by mouth daily.        . Multiple Vitamins-Minerals (ICAPS PO) Take by mouth daily.          No Known Allergies  Review of Systems negative except from HPI and PMH  Physical Exam BP 153/86  Pulse 60  Ht 5\' 11"  (1.803 m)  Wt 144 lb 12.8 oz (65.681 kg)  BMI 20.20 kg/m2 Well developed and well nourished in no acute distress HENT normal E  scleral and icterus clear Neck Supple JVP flat; carotids brisk and full Clear to ausculation Regular rate and rhythm, no murmurs gallops or rub Soft with active bowel sounds; is a broad abdominal pulsating mass No clubbing cyanosis none Edema Alert and oriented, grossly normal motor and sensory function Skin Warm and Dry  Electrocardiogram today demonstrates AV pacing at approximately 300 ms with some degree of pseudofusion Assessment and  Plan

## 2011-10-17 NOTE — Patient Instructions (Addendum)
Please schedule for an abdominal ultrasound.  Your physician wants you to follow-up in: 1 year with Dr. Graciela Husbands.  You will receive a reminder letter in the mail two months in advance. If you don't receive a letter, please call our office to schedule the follow-up appointment.

## 2011-10-17 NOTE — Assessment & Plan Note (Signed)
The patient's device was interrogated and the information was fully reviewed.  The device was reprogrammed as above. 

## 2011-10-22 ENCOUNTER — Ambulatory Visit (HOSPITAL_COMMUNITY)
Admission: RE | Admit: 2011-10-22 | Discharge: 2011-10-22 | Disposition: A | Discharge status: Home / Self Care | Payer: Medicare Other | Source: Ambulatory Visit | Attending: Internal Medicine | Admitting: Internal Medicine

## 2011-10-22 ENCOUNTER — Other Ambulatory Visit: Payer: Self-pay | Admitting: Internal Medicine

## 2011-10-22 DIAGNOSIS — R1900 Intra-abdominal and pelvic swelling, mass and lump, unspecified site: Secondary | ICD-10-CM

## 2011-10-24 ENCOUNTER — Telehealth: Payer: Self-pay | Admitting: Internal Medicine

## 2011-10-24 NOTE — Telephone Encounter (Signed)
New problem:  Test results.  

## 2011-10-31 NOTE — Progress Notes (Signed)
Notified. 

## 2011-11-02 ENCOUNTER — Ambulatory Visit (INDEPENDENT_AMBULATORY_CARE_PROVIDER_SITE_OTHER): Payer: Medicare Other | Admitting: Internal Medicine

## 2011-11-02 DIAGNOSIS — Z0000 Encounter for general adult medical examination without abnormal findings: Secondary | ICD-10-CM

## 2011-11-02 DIAGNOSIS — Z23 Encounter for immunization: Secondary | ICD-10-CM

## 2011-11-02 NOTE — Telephone Encounter (Signed)
Notified. 

## 2011-12-06 ENCOUNTER — Ambulatory Visit (INDEPENDENT_AMBULATORY_CARE_PROVIDER_SITE_OTHER): Payer: Medicare Other | Admitting: Internal Medicine

## 2011-12-06 ENCOUNTER — Encounter: Payer: Self-pay | Admitting: Internal Medicine

## 2011-12-06 VITALS — BP 140/80 | HR 73 | Temp 97.2°F | Resp 18 | Wt 148.0 lb

## 2011-12-06 DIAGNOSIS — I4891 Unspecified atrial fibrillation: Secondary | ICD-10-CM

## 2011-12-06 DIAGNOSIS — E039 Hypothyroidism, unspecified: Secondary | ICD-10-CM

## 2011-12-06 DIAGNOSIS — E785 Hyperlipidemia, unspecified: Secondary | ICD-10-CM

## 2011-12-06 DIAGNOSIS — Z950 Presence of cardiac pacemaker: Secondary | ICD-10-CM

## 2011-12-06 NOTE — Progress Notes (Signed)
  Subjective:    Patient ID: Jerome Garcia, male    DOB: 04-20-1923, 78 y.o.   MRN: [id number]  HPI  78 year old patient who is seen today for his by annual followup. He is followed by cardiology status post pacemaker insertion for trifascicular block and syncope. He has hypothyroidism and dyslipidemia. He has had a recent abdominal ultrasound that did not reveal any significant AAA. Clinically doing quite well. No concerns or complaints.  BP Readings from Last 3 Encounters:  12/06/11 140/80  10/17/11 153/86  10/11/11 122/81    Wt Readings from Last 3 Encounters:  12/06/11 148 lb (67.132 kg)  10/17/11 144 lb 12.8 oz (65.681 kg)  10/11/11 140 lb (63.504 kg)    Review of Systems  Constitutional: Negative for fever, chills, appetite change and fatigue.  HENT: Negative for hearing loss, ear pain, congestion, sore throat, trouble swallowing, neck stiffness, dental problem, voice change and tinnitus.   Eyes: Negative for pain, discharge and visual disturbance.  Respiratory: Negative for cough, chest tightness, wheezing and stridor.   Cardiovascular: Negative for chest pain, palpitations and leg swelling.  Gastrointestinal: Negative for nausea, vomiting, abdominal pain, diarrhea, constipation, blood in stool and abdominal distention.  Genitourinary: Negative for urgency, hematuria, flank pain, discharge, difficulty urinating and genital sores.  Musculoskeletal: Negative for myalgias, back pain, joint swelling, arthralgias and gait problem.  Skin: Negative for rash.  Neurological: Negative for dizziness, syncope, speech difficulty, weakness, numbness and headaches.  Hematological: Negative for adenopathy. Does not bruise/bleed easily.  Psychiatric/Behavioral: Negative for behavioral problems and dysphoric mood. The patient is not nervous/anxious.        Objective:   Physical Exam  Constitutional: He is oriented to person, place, and time. He appears well-developed.  HENT:  Head:  Normocephalic.  Right Ear: External ear normal.  Left Ear: External ear normal.  Eyes: Conjunctivae normal and EOM are normal.  Neck: Normal range of motion.  Cardiovascular: Normal rate and normal heart sounds.   Pulmonary/Chest: Breath sounds normal.       Pacemaker noted left upper anterior chest wall  Abdominal: Bowel sounds are normal.       Prominent aortic pulsation  Musculoskeletal: Normal range of motion. He exhibits no edema and no tenderness.  Neurological: He is alert and oriented to person, place, and time.  Psychiatric: He has a normal mood and affect. His behavior is normal.          Assessment & Plan:   Dyslipidemia Hypothyroidism Status post pacemaker carotid artery disease. Asymptomatic  No changes medications. Recheck 1 year

## 2011-12-06 NOTE — Patient Instructions (Signed)
Limit your sodium (Salt) intake  Return in 6 months for follow-up  

## 2012-01-10 ENCOUNTER — Encounter: Payer: Self-pay | Admitting: Internal Medicine

## 2012-01-10 DIAGNOSIS — I495 Sick sinus syndrome: Secondary | ICD-10-CM

## 2012-02-22 ENCOUNTER — Telehealth: Payer: Self-pay | Admitting: Internal Medicine

## 2012-02-22 NOTE — Telephone Encounter (Signed)
LMOM for pt to return call/kwm  

## 2012-02-22 NOTE — Telephone Encounter (Signed)
New Problem    Pt got a new tablet and he states the tablets user guide says it can interfere with his pacemaker. He wants to know if he should be concerned.

## 2012-02-22 NOTE — Telephone Encounter (Signed)
Spoke w/pt and instructed pt tablet is fine to use with pacemaker.

## 2012-04-10 ENCOUNTER — Ambulatory Visit: Payer: Medicare Other | Admitting: Neurosurgery

## 2012-04-10 ENCOUNTER — Other Ambulatory Visit: Payer: Medicare Other

## 2012-04-10 DIAGNOSIS — I495 Sick sinus syndrome: Secondary | ICD-10-CM

## 2012-05-12 ENCOUNTER — Encounter: Payer: Self-pay | Admitting: Vascular Surgery

## 2012-05-13 ENCOUNTER — Other Ambulatory Visit (INDEPENDENT_AMBULATORY_CARE_PROVIDER_SITE_OTHER): Payer: Medicare Other | Admitting: *Deleted

## 2012-05-13 ENCOUNTER — Encounter: Payer: Self-pay | Admitting: Vascular Surgery

## 2012-05-13 ENCOUNTER — Ambulatory Visit (INDEPENDENT_AMBULATORY_CARE_PROVIDER_SITE_OTHER): Payer: Medicare Other | Admitting: Vascular Surgery

## 2012-05-13 DIAGNOSIS — I6529 Occlusion and stenosis of unspecified carotid artery: Secondary | ICD-10-CM

## 2012-05-13 DIAGNOSIS — Z48812 Encounter for surgical aftercare following surgery on the circulatory system: Secondary | ICD-10-CM

## 2012-05-13 NOTE — Progress Notes (Signed)
The patient presents today for continued followup of his extracranial screw vascular occlusive disease. He remains quite alert and does have some difficulty with hearing loss but has had no neurologic deficits. He denies any cardiac difficulty as well. He does have a known occluded right internal carotid artery and moderate to severe stenosis in his left carotid. He's had no significant change since his initial discovery back in 2005.  Past Medical History  Diagnosis Date  . Hx of bladder cancer   . HLD (hyperlipidemia)   . Trifascicular block     history  . Hypothyroidism   . Carotid artery stenosis     left carotid bruit  . Benign prostatic hypertrophy   . Gastric outlet obstruction 06/2007    history  . Pacemaker     History  Substance Use Topics  . Smoking status: Former Smoker -- 30 years    Types: Cigarettes    Quit date: 01/01/1978  . Smokeless tobacco: Never Used  . Alcohol Use: No    Family History  Problem Relation Age of Onset  . Throat cancer Father     in his 18's  . Lung cancer Mother   . Other Brother     history of oral cancer  . Heart attack Sister   . Dementia Sister     No Known Allergies  Current outpatient prescriptions:aspirin 81 MG tablet, Take 81 mg by mouth daily.  , Disp: , Rfl: ;  Calcium Citrate-Vitamin D (CITRACAL + D PO), Take 1 tablet by mouth daily.  , Disp: , Rfl: ;  levothyroxine (SYNTHROID, LEVOTHROID) 175 MCG tablet, Take 1 tablet (175 mcg total) by mouth daily., Disp: 90 tablet, Rfl: 6;  Multiple Vitamin (MULTIVITAMIN) tablet, Take 1 tablet by mouth daily.  , Disp: , Rfl:  Multiple Vitamins-Minerals (ICAPS PO), Take by mouth daily.  , Disp: , Rfl: ;  simvastatin (ZOCOR) 20 MG tablet, Take 1 tablet (20 mg total) by mouth at bedtime., Disp: 90 tablet, Rfl: 6;  vitamin E 400 UNIT capsule, Take 400 Units by mouth daily.  , Disp: , Rfl:   BP 167/98  Pulse 66  Resp 18  Ht 5\' 11"  (1.803 m)  Wt 142 lb (64.411 kg)  BMI 19.81 kg/m2  Body mass  index is 19.81 kg/(m^2).       Physical exam a well-developed gentleman in no acute distress Grossly intact neurologically Heart regular rate rate and rhythm without murmur Aspirations clear with equal breath sounds bilaterally Pulse status: 2+ radial pulses bilaterally Brought artery U. without bruits bilaterally  Carotid duplex today was interpreted by myself. I discussed this with the patient and his wife. He does have occluded right internal carotid artery and moderate to severe 60-79% left carotid stenosis  Impression and plan: Stable extracranial cerebrovascular vascular occlusive disease with minimal change since 2005. Discussed symptoms of carotid disease with the patient and his wife and he'll notify us should this occur. Otherwise will be seen at 6 month intervals with surveillance of his moderate to severe left carotid stenosis

## 2012-06-05 ENCOUNTER — Other Ambulatory Visit: Payer: Self-pay | Admitting: Internal Medicine

## 2012-06-23 ENCOUNTER — Other Ambulatory Visit: Payer: Self-pay | Admitting: Internal Medicine

## 2012-07-10 DIAGNOSIS — I495 Sick sinus syndrome: Secondary | ICD-10-CM

## 2012-10-09 DIAGNOSIS — I495 Sick sinus syndrome: Secondary | ICD-10-CM

## 2012-11-06 ENCOUNTER — Other Ambulatory Visit: Payer: Self-pay

## 2012-11-09 ENCOUNTER — Other Ambulatory Visit: Payer: Self-pay | Admitting: Internal Medicine

## 2012-11-11 ENCOUNTER — Encounter: Payer: Self-pay | Admitting: Internal Medicine

## 2012-11-18 ENCOUNTER — Other Ambulatory Visit: Payer: Medicare Other

## 2012-11-18 ENCOUNTER — Ambulatory Visit: Payer: Medicare Other | Admitting: Vascular Surgery

## 2012-12-08 ENCOUNTER — Encounter: Payer: Self-pay | Admitting: Vascular Surgery

## 2012-12-09 ENCOUNTER — Other Ambulatory Visit (HOSPITAL_COMMUNITY): Payer: Medicare Other

## 2012-12-09 ENCOUNTER — Ambulatory Visit (HOSPITAL_COMMUNITY)
Admission: RE | Admit: 2012-12-09 | Discharge: 2012-12-09 | Disposition: A | Discharge status: Home / Self Care | Payer: Medicare Other | Source: Ambulatory Visit | Attending: Vascular Surgery | Admitting: Vascular Surgery

## 2012-12-09 ENCOUNTER — Ambulatory Visit (INDEPENDENT_AMBULATORY_CARE_PROVIDER_SITE_OTHER): Payer: Medicare Other | Admitting: Vascular Surgery

## 2012-12-09 ENCOUNTER — Encounter: Payer: Self-pay | Admitting: Vascular Surgery

## 2012-12-09 DIAGNOSIS — I6529 Occlusion and stenosis of unspecified carotid artery: Secondary | ICD-10-CM | POA: Insufficient documentation

## 2012-12-09 DIAGNOSIS — I658 Occlusion and stenosis of other precerebral arteries: Secondary | ICD-10-CM | POA: Insufficient documentation

## 2012-12-09 NOTE — Addendum Note (Signed)
Addended by: Sharee Pimple on: 12/09/2012 02:20 PM   Modules accepted: Orders

## 2012-12-09 NOTE — Progress Notes (Signed)
2    Patient name: Jerome Garcia MRN: [id number] DOB: 1923-02-02 Sex: male     Reason for referral:  Chief Complaint  Patient presents with  . Follow-up    6 month FU  carotid duplex prior  . Carotid    HISTORY OF PRESENT ILLNESS: The patient presents today for followup of asymptomatic extracranial cerebrovascular occlusive disease. He denies any new neurologic deficits. Specifically no amaurosis fugax, transient ischemic attack or stroke. He does report some memory difficulty and feels that this is relatively stable. He has no new cardiac difficulties  Past Medical History  Diagnosis Date  . Hx of bladder cancer   . HLD (hyperlipidemia)   . Trifascicular block     history  . Hypothyroidism   . Carotid artery stenosis     left carotid bruit  . Benign prostatic hypertrophy   . Gastric outlet obstruction 06/2007    history  . Pacemaker     Past Surgical History  Procedure Laterality Date  . Pacemaker insertion  03/2007    permanent transvenous, [employer]  . Hemorroidectomy    . Orchiectomy    . Gastrojejunostomy  06/2007  . Esophagogastroduodenoscopy  07/2008    History   Social History  . Marital Status: Married    Spouse Name: N/A    Number of Children: N/A  . Years of Education: N/A   Occupational History  . Not on file.   Social History Main Topics  . Smoking status: Former Smoker -- 30 years    Types: Cigarettes    Quit date: 01/01/1978  . Smokeless tobacco: Never Used  . Alcohol Use: No  . Drug Use: No  . Sexual Activity: Not on file   Other Topics Concern  . Not on file   Social History Narrative  . No narrative on file    Family History  Problem Relation Age of Onset  . Throat cancer Father     in his 11's  . Lung cancer Mother   . Other Brother     history of oral cancer  . Heart attack Sister   . Dementia Sister     Allergies as of 12/09/2012  . (No Known Allergies)    Current Outpatient Prescriptions on File Prior to  Visit  Medication Sig Dispense Refill  . aspirin 81 MG tablet Take 81 mg by mouth daily.        . Calcium Citrate-Vitamin D (CITRACAL + D PO) Take 2 tablets by mouth daily.       Marland Kitchen levothyroxine (SYNTHROID, LEVOTHROID) 175 MCG tablet TAKE 1 TABLET (175 MCG TOTAL) DAILY  90 tablet  3  . Multiple Vitamin (MULTIVITAMIN) tablet Take 1 tablet by mouth daily.        . Multiple Vitamins-Minerals (ICAPS PO) Take by mouth daily.        . simvastatin (ZOCOR) 20 MG tablet TAKE 1 TABLET AT BEDTIME  90 tablet  0  . vitamin E 400 UNIT capsule Take 400 Units by mouth daily.         No current facility-administered medications on file prior to visit.        PHYSICAL EXAMINATION:  General: The patient is a well-nourished male, in no acute distress. Vital signs are BP 170/82  Pulse 76  Resp 16  Ht 5\' 11"  (1.803 m)  Wt 150 lb 9.6 oz (68.312 kg)  BMI 21.01 kg/m2 Pulmonary: There is a good air exchange bilaterally without wheezing or rales. Abdomen: Soft  and non-tender with normal pitch bowel sounds. Musculoskeletal: There are no major deformities.  There is no significant extremity pain. Neurologic: No focal weakness or paresthesias are detected, Skin: There are no ulcer or rashes noted. Psychiatric: The patient has normal affect. Cardiovascular: There is a regular rate and rhythm without significant murmur appreciated. Carotid arteries without bruits bilaterally 2+ radial pulses bilaterally  [facility] Vascular Lab Studies:  Ordered and Independently Reviewed reveals known occlusion of his right internal carotid artery. Ther 60-79% stenosis.e is moderate stenosis in his left internal carotid artery. Today's velocities suggest a high and of the 40-59% stenoAsymptomatic carotid disease..Last a study 6 months ago suggested that low and of the  Impression and plan: Asymptomatic carotid disease. Again discussed symptoms of carotid disease with the patient and his wife. We'll notify us immediate should this  occur will present to the emergency department should he have any symptoms of stroke. We'll see him again in one year with repeat carotid duplex      Emil Weigold [facility] Office: [phone]

## 2012-12-25 ENCOUNTER — Encounter: Payer: Self-pay | Admitting: Internal Medicine

## 2013-01-08 ENCOUNTER — Encounter: Payer: Self-pay | Admitting: Internal Medicine

## 2013-01-08 DIAGNOSIS — I495 Sick sinus syndrome: Secondary | ICD-10-CM

## 2013-01-09 ENCOUNTER — Encounter: Payer: Self-pay | Admitting: Internal Medicine

## 2013-01-09 ENCOUNTER — Ambulatory Visit (INDEPENDENT_AMBULATORY_CARE_PROVIDER_SITE_OTHER): Payer: Medicare Other | Admitting: Internal Medicine

## 2013-01-09 VITALS — BP 124/80 | HR 65 | Temp 97.5°F | Resp 18 | Ht 71.0 in | Wt 150.0 lb

## 2013-01-09 DIAGNOSIS — C679 Malignant neoplasm of bladder, unspecified: Secondary | ICD-10-CM

## 2013-01-09 DIAGNOSIS — Z0000 Encounter for general adult medical examination without abnormal findings: Secondary | ICD-10-CM

## 2013-01-09 DIAGNOSIS — I4891 Unspecified atrial fibrillation: Secondary | ICD-10-CM

## 2013-01-09 DIAGNOSIS — Z23 Encounter for immunization: Secondary | ICD-10-CM

## 2013-01-09 DIAGNOSIS — N400 Enlarged prostate without lower urinary tract symptoms: Secondary | ICD-10-CM

## 2013-01-09 DIAGNOSIS — E039 Hypothyroidism, unspecified: Secondary | ICD-10-CM

## 2013-01-09 DIAGNOSIS — E785 Hyperlipidemia, unspecified: Secondary | ICD-10-CM

## 2013-01-09 LAB — COMPREHENSIVE METABOLIC PANEL
ALT: 15 U/L (ref 0–53)
AST: 24 U/L (ref 0–37)
Albumin: 3.7 g/dL (ref 3.5–5.2)
Alkaline Phosphatase: 63 U/L (ref 39–117)
BILIRUBIN TOTAL: 0.6 mg/dL (ref 0.3–1.2)
BUN: 25 mg/dL — ABNORMAL HIGH (ref 6–23)
CHLORIDE: 106 meq/L (ref 96–112)
CO2: 30 mEq/L (ref 19–32)
Calcium: 8.9 mg/dL (ref 8.4–10.5)
Creatinine, Ser: 1.4 mg/dL (ref 0.4–1.5)
GFR: 51.68 mL/min — AB (ref >60.00)
Glucose, Bld: 84 mg/dL (ref 70–99)
Potassium: 4.6 mEq/L (ref 3.5–5.1)
Sodium: 141 mEq/L (ref 135–145)
TOTAL PROTEIN: 6.9 g/dL (ref 6.0–8.3)

## 2013-01-09 LAB — CBC WITH DIFFERENTIAL/PLATELET
BASOS ABS: 0 10*3/uL (ref 0.0–0.1)
Basophils Relative: 0.5 % (ref 0.0–3.0)
Eosinophils Absolute: 0.1 10*3/uL (ref 0.0–0.7)
Eosinophils Relative: 1.8 % (ref 0.0–5.0)
HEMATOCRIT: 44.1 % (ref 39.0–52.0)
Hemoglobin: 14.6 g/dL (ref 13.0–17.0)
Lymphocytes Relative: 28.1 % (ref 12.0–46.0)
Lymphs Abs: 2 10*3/uL (ref 0.7–4.0)
MCHC: 33.1 g/dL (ref 30.0–36.0)
MCV: 92.2 fl (ref 78.0–100.0)
MONO ABS: 0.6 10*3/uL (ref 0.1–1.0)
MONOS PCT: 8.6 % (ref 3.0–12.0)
Neutro Abs: 4.4 10*3/uL (ref 1.4–7.7)
Neutrophils Relative %: 61 % (ref 43.0–77.0)
Platelets: 189 10*3/uL (ref 150.0–400.0)
RBC: 4.78 Mil/uL (ref 4.22–5.81)
RDW: 14.1 % (ref 11.5–14.6)
WBC: 7.2 10*3/uL (ref 4.5–10.5)

## 2013-01-09 LAB — TSH: TSH: 0.27 u[IU]/mL — ABNORMAL LOW (ref 0.35–5.50)

## 2013-01-09 LAB — LIPID PANEL
CHOL/HDL RATIO: 3
Cholesterol: 139 mg/dL (ref 0–200)
HDL: 48.5 mg/dL (ref >39.00)
LDL Cholesterol: 63 mg/dL (ref 0–99)
Triglycerides: 140 mg/dL (ref 0.0–149.0)
VLDL: 28 mg/dL (ref 0.0–40.0)

## 2013-01-09 MED ORDER — SIMVASTATIN 20 MG PO TABS: ORAL_TABLET | ORAL | Status: DC

## 2013-01-09 MED ORDER — LEVOTHYROXINE SODIUM 175 MCG PO TABS: ORAL_TABLET | ORAL | Status: DC

## 2013-01-09 NOTE — Patient Instructions (Signed)
Limit your sodium (Salt) intake    It is important that you exercise regularly, at least 20 minutes 3 to 4 times per week.  If you develop chest pain or shortness of breath seek  medical attention.  Return in one year for follow-up   

## 2013-01-09 NOTE — Progress Notes (Signed)
Pre-visit discussion using our clinic review tool. No additional management support is needed unless otherwise documented below in the visit note.  

## 2013-01-09 NOTE — Progress Notes (Signed)
Subjective:    Patient ID: Jerome Garcia, male    DOB: 16-Jul-1923, 80 y.o.   MRN: [id number]  HPI 80 year-old patient who is seen today for an annual exam. He is followed by cardiology status post pacemaker insertion for trifascicular block. He has a history of dyslipidemia carotid artery disease and hypothyroidism. He is doing quite well. No major concerns or complaints. Recent carotid artery Doppler study  Preventive Screening-Counseling & Management  Alcohol-Tobacco  Alcohol drinks/day: 0  Smoking Status: quit  Caffeine-Diet-Exercise  Caffeine Counseling: not indicated; caffeine use is not excessive or problematic  Nutrition Referrals: no  Does Patient Exercise: yes  Type of exercise: walking  Exercise Counseling: to improve exercise regimen  [place] Depression Score: nonapplicable  Depression Counseling: not indicated; screening negative for depression   Allergies (verified):  No Known Drug Allergies   Past History:  Past Medical History:  Bladder CA, hx  Hyperlipidemia  Hypothyroidism  Trifascicular block, hx  Carotid artery stenosis- left carotid bruit  Benign prostatic hypertrophy  history of gastric outlet obstruction. June 2009   Past Surgical History:  s/p permanent transvenous pacemaker placement, 3/09  Hemorrhoidectomy  Orchiectomy  colonoscopy 07-2008  status post gastrojejunostomy June 2009- EGD 07-2008   Family History:  Reviewed history from 02/21/2007 and no changes required.  father died in his 1s, and throat cancer  mother died at 86 lungs cancer  One brother history of oral cancer and died of suicide death  one sister-s/p MI, dementia (69)   Social History:  Reviewed history from 02/27/2008 and no changes required.  Married  Tobacco Use - Former.  Alcohol Use - no  Does Patient Exercise: yes   Medicare wellness:   1. Risk factors based on Past M, S, F history: known coronary artery disease. He is on simvastatin for cholesterol control  2.  Physical Activities: fairly active for age,  but no rigorous exercise regimen  3. Depression/mood: none  4. Hearing: moderately impaired. He uses hearing aids  5. ADL's: no restrictions remains fairly active  6. Fall Risk: low fall risk  7. Home Safety: reviewed no concerns  8. Height, weight, &visual acuity: visual acuity 20/25  9. Counseling: more active physical activity discussed  10. Labs ordered based on risk factors: CBC chemistries PSA lipid profile  11. Referral Coordination follow cardiology discussed  12. Care Plan- follow cardiology return office visit here for lab in one year; will schedule bone density     Review of Systems  Constitutional: Negative for fever, chills, activity change, appetite change and fatigue.  HENT: Negative for congestion, dental problem, ear pain, hearing loss, mouth sores, rhinorrhea, sinus pressure, sneezing, tinnitus, trouble swallowing and voice change.   Eyes: Negative for photophobia, pain, redness and visual disturbance.  Respiratory: Negative for apnea, cough, choking, chest tightness, shortness of breath and wheezing.   Cardiovascular: Negative for chest pain, palpitations and leg swelling.  Gastrointestinal: Negative for nausea, vomiting, abdominal pain, diarrhea, constipation, blood in stool, abdominal distention, anal bleeding and rectal pain.  Genitourinary: Negative for dysuria, urgency, frequency, hematuria, flank pain, decreased urine volume, discharge, penile swelling, scrotal swelling, difficulty urinating, genital sores and testicular pain.  Musculoskeletal: Negative for arthralgias, back pain, gait problem, joint swelling, myalgias, neck pain and neck stiffness.  Skin: Negative for color change, rash and wound.  Neurological: Negative for dizziness, tremors, seizures, syncope, facial asymmetry, speech difficulty, weakness, light-headedness, numbness and headaches.  Hematological: Negative for adenopathy. Does not bruise/bleed easily.   Psychiatric/Behavioral: Negative  for suicidal ideas, hallucinations, behavioral problems, confusion, sleep disturbance, self-injury, dysphoric mood, decreased concentration and agitation. The patient is not nervous/anxious.    her     Objective:   Physical Exam  Constitutional: He appears well-developed and well-nourished.  Blood pressure 120/70 in the right; blood pressure 100/70 in the left  HENT:  Head: Normocephalic and atraumatic.  Right Ear: External ear normal.  Left Ear: External ear normal.  Nose: Nose normal.  Mouth/Throat: Oropharynx is clear and moist.  Eyes: Conjunctivae and EOM are normal. Pupils are equal, round, and reactive to light. No scleral icterus.  Neck: Normal range of motion. Neck supple. No JVD present. No thyromegaly present.  Left carotid bruit  Cardiovascular: Regular rhythm and normal heart sounds.  Exam reveals no gallop and no friction rub.   No murmur heard. Posterior tibial pulses full. Dorsalis pedis pulses not easily palpable  Pulmonary/Chest: Effort normal and breath sounds normal. He exhibits no tenderness.  Pacemaker left upper anterior chest wall area  Abdominal: Soft. Bowel sounds are normal. He exhibits no distension and no mass. There is no tenderness.  Prominent epigastric pulsation  Genitourinary: Prostate normal and penis normal.  Testicular atrophy  Musculoskeletal: Normal range of motion. He exhibits no edema and no tenderness.  Lymphadenopathy:    He has no cervical adenopathy.  Neurological: He is alert. He has normal reflexes. No cranial nerve deficit. Coordination normal.  Skin: Skin is warm and dry. No rash noted.  Psychiatric: He has a normal mood and affect. His behavior is normal.          Assessment & Plan:   Annual exam  Carotid artery stenosis. Continue aggressive risk factor modification  Status post pacemaker for trifascicular block Dyslipidemia. Will continue simvastatin and check a lipid  profile Hypothyroidism. We'll check a TSH

## 2013-01-17 ENCOUNTER — Encounter: Payer: Self-pay | Admitting: Internal Medicine

## 2013-01-19 NOTE — Telephone Encounter (Signed)
Called pt and left message with wife to call office.

## 2013-01-20 NOTE — Telephone Encounter (Signed)
Message copied by Marian Sorrow on Tue Jan 20, 2013  1:45 PM ------      Message from: Scherrie Gerlach      Created: Tue Jan 20, 2013 11:26 AM      Regarding: colonoscopy      Contact: [phone]       Pt states the colonoscopy shows up on mychart as pt needing to have done. Pt has been told he doesn't need to have one  Pt would like this taken off of his chart as been being due. Can you do that? ------

## 2013-01-20 NOTE — Telephone Encounter (Signed)
Spoke to pt told him just to ignore what is says in My Chart I can not change it but I can change due date. Pt verbalized understanding.

## 2013-04-09 DIAGNOSIS — I495 Sick sinus syndrome: Secondary | ICD-10-CM

## 2013-05-19 ENCOUNTER — Encounter: Payer: Self-pay | Admitting: Internal Medicine

## 2013-06-01 ENCOUNTER — Encounter: Payer: Self-pay | Admitting: Cardiology

## 2013-06-02 ENCOUNTER — Telehealth: Payer: Self-pay | Admitting: Internal Medicine

## 2013-06-02 NOTE — Telephone Encounter (Signed)
06-01-13 sent past due device check certified letter/mt °

## 2013-06-12 NOTE — Telephone Encounter (Signed)
[id number] 7-49-44 CERTIFIED LETTER SIGNED BY BETTY Thrush 06-06-13 AND APPT MADE FOR 07-28-13 AT 1215PM/MT

## 2013-07-28 ENCOUNTER — Ambulatory Visit (INDEPENDENT_AMBULATORY_CARE_PROVIDER_SITE_OTHER): Payer: Medicare Other | Admitting: Internal Medicine

## 2013-07-28 ENCOUNTER — Encounter: Payer: Self-pay | Admitting: Internal Medicine

## 2013-07-28 VITALS — BP 120/0 | HR 72 | Ht 72.0 in | Wt 148.0 lb

## 2013-07-28 DIAGNOSIS — I4891 Unspecified atrial fibrillation: Secondary | ICD-10-CM

## 2013-07-28 DIAGNOSIS — I453 Trifascicular block: Secondary | ICD-10-CM

## 2013-07-28 DIAGNOSIS — I480 Paroxysmal atrial fibrillation: Secondary | ICD-10-CM

## 2013-07-28 DIAGNOSIS — Z950 Presence of cardiac pacemaker: Secondary | ICD-10-CM

## 2013-07-28 LAB — MDC_IDC_ENUM_SESS_TYPE_INCLINIC
Battery Remaining Longevity: 18 mo
Brady Statistic RA Percent Paced: 95 %
Implantable Pulse Generator Model: 1297
Lead Channel Impedance Value: 550 Ohm
Lead Channel Pacing Threshold Amplitude: 0.4 V
Lead Channel Pacing Threshold Amplitude: 0.5 V
Lead Channel Pacing Threshold Pulse Width: 0.4 ms
Lead Channel Sensing Intrinsic Amplitude: 12 mV
Lead Channel Setting Pacing Amplitude: 2 V
Lead Channel Setting Pacing Pulse Width: 0.4 ms
Lead Channel Setting Sensing Sensitivity: 2.5 mV
MDC IDC MSMT LEADCHNL RA IMPEDANCE VALUE: 550 Ohm
MDC IDC MSMT LEADCHNL RA PACING THRESHOLD PULSEWIDTH: 0.4 ms
MDC IDC MSMT LEADCHNL RA SENSING INTR AMPL: 2.8 mV
MDC IDC PG SERIAL: 316920
MDC IDC SET LEADCHNL RV PACING AMPLITUDE: 2.5 V
MDC IDC STAT BRADY RV PERCENT PACED: 100 %

## 2013-07-28 NOTE — Patient Instructions (Addendum)
Your physician recommends that you continue on your current medications as directed. Please refer to the Current Medication list given to you today.  Your physician wants you to follow-up in: 6 months with device clinic.  You will receive a reminder letter in the mail two months in advance. If you don't receive a letter, please call our office to schedule the follow-up appointment.  Your physician wants you to follow-up in: 12 months with Dr. Caryl Comes. You will receive a reminder letter in the mail two months in advance. If you don't receive a letter, please call our office to schedule the follow-up appointment.

## 2013-07-28 NOTE — Progress Notes (Signed)
Patient Care Team: Marletta Lor, MD as PCP - General Lelon Perla, MD (Cardiology)   HPI  Jerome Garcia is a 80 y.o. male seen in followup for a [employer] pacemaker implanted for syncope and trifascicular block.  He has no specific complaints of dizziness. He denies chest pain peripheral edema.   There is no family history of aneurysmal disease.     Past Medical History  Diagnosis Date  . Hx of bladder cancer   . HLD (hyperlipidemia)   . Trifascicular block     history  . Hypothyroidism   . Carotid artery stenosis     left carotid bruit  . Benign prostatic hypertrophy   . Gastric outlet obstruction 06/2007    history  . Pacemaker     Past Surgical History  Procedure Laterality Date  . Pacemaker insertion  03/2007    permanent transvenous, [employer]  . Hemorroidectomy    . Orchiectomy    . Gastrojejunostomy  06/2007  . Esophagogastroduodenoscopy  07/2008    Current Outpatient Prescriptions  Medication Sig Dispense Refill  . aspirin 81 MG tablet Take 81 mg by mouth daily.        . Calcium Citrate-Vitamin D (CITRACAL + D PO) Take 2 tablets by mouth daily.       Marland Kitchen levothyroxine (SYNTHROID, LEVOTHROID) 175 MCG tablet TAKE 1 TABLET (175 MCG TOTAL) DAILY  90 tablet  3  . Multiple Vitamin (MULTIVITAMIN) tablet Take 1 tablet by mouth daily.        . Multiple Vitamins-Minerals (ICAPS PO) Take by mouth daily.        . simvastatin (ZOCOR) 20 MG tablet TAKE 1 TABLET AT BEDTIME  90 tablet  6  . vitamin E 400 UNIT capsule Take 400 Units by mouth daily.         No current facility-administered medications for this visit.    No Known Allergies  Review of Systems negative except from HPI and PMH  Physical Exam BP 150/93  Pulse 72  Ht 6' (1.829 m)  Wt 148 lb (67.132 kg)  BMI 20.07 kg/m2 Repeat BP 120 sys Well developed and well nourished in no acute distress HENT normal E scleral and icterus clear Neck Supple JVP flat; carotids brisk and  full Clear to ausculation Regular rate and rhythm, no murmurs gallops or rub Soft with active bowel sounds; is a broad abdominal pulsating mass No clubbing cyanosis none Edema Alert and oriented, grossly normal motor and sensory function Skin Warm and Dry  Electrocardiogram today demonstrates AV pacing at approximately 300 ms with some degree of pseudofusion Assessment and  Plan  Syncope  Trifascicular block  Pacemaker [employer] The patient's device was interrogated.  The information was reviewed. No changes were made in the programming.    No recurrent syncope

## 2013-07-31 ENCOUNTER — Encounter: Payer: Self-pay | Admitting: Internal Medicine

## 2013-08-10 DIAGNOSIS — I495 Sick sinus syndrome: Secondary | ICD-10-CM

## 2013-08-26 ENCOUNTER — Encounter: Payer: Self-pay | Admitting: Internal Medicine

## 2013-11-09 ENCOUNTER — Encounter: Payer: Self-pay | Admitting: Internal Medicine

## 2013-11-09 DIAGNOSIS — I495 Sick sinus syndrome: Secondary | ICD-10-CM

## 2013-12-07 ENCOUNTER — Encounter: Payer: Self-pay | Admitting: Vascular Surgery

## 2013-12-08 ENCOUNTER — Ambulatory Visit (HOSPITAL_COMMUNITY)
Admission: RE | Admit: 2013-12-08 | Discharge: 2013-12-08 | Disposition: A | Discharge status: Home / Self Care | Payer: Medicare Other | Source: Ambulatory Visit | Attending: Family | Admitting: Family

## 2013-12-08 ENCOUNTER — Ambulatory Visit (INDEPENDENT_AMBULATORY_CARE_PROVIDER_SITE_OTHER): Payer: Medicare Other | Admitting: Family

## 2013-12-08 ENCOUNTER — Encounter: Payer: Self-pay | Admitting: Family

## 2013-12-08 VITALS — BP 145/85 | HR 68 | Resp 16 | Ht 70.0 in | Wt 148.0 lb

## 2013-12-08 DIAGNOSIS — I6523 Occlusion and stenosis of bilateral carotid arteries: Secondary | ICD-10-CM | POA: Diagnosis present

## 2013-12-08 NOTE — Addendum Note (Signed)
Addended by: Mena Goes on: 12/08/2013 02:41 PM   Modules accepted: Orders

## 2013-12-08 NOTE — Patient Instructions (Signed)
Stroke Prevention Some medical conditions and behaviors are associated with an increased chance of having a stroke. You may prevent a stroke by making healthy choices and managing medical conditions. HOW CAN I REDUCE MY RISK OF HAVING A STROKE?   Stay physically active. Get at least 30 minutes of activity on most or all days.  Do not smoke. It may also be helpful to avoid exposure to secondhand smoke.  Limit alcohol use. Moderate alcohol use is considered to be:  No more than 2 drinks per day for men.  No more than 1 drink per day for nonpregnant women.  Eat healthy foods. This involves:  Eating 5 or more servings of fruits and vegetables a day.  Making dietary changes that address high blood pressure (hypertension), high cholesterol, diabetes, or obesity.  Manage your cholesterol levels.  Making food choices that are high in fiber and low in saturated fat, trans fat, and cholesterol may control cholesterol levels.  Take any prescribed medicines to control cholesterol as directed by your health care provider.  Manage your diabetes.  Controlling your carbohydrate and sugar intake is recommended to manage diabetes.  Take any prescribed medicines to control diabetes as directed by your health care provider.  Control your hypertension.  Making food choices that are low in salt (sodium), saturated fat, trans fat, and cholesterol is recommended to manage hypertension.  Take any prescribed medicines to control hypertension as directed by your health care provider.  Maintain a healthy weight.  Reducing calorie intake and making food choices that are low in sodium, saturated fat, trans fat, and cholesterol are recommended to manage weight.  Stop drug abuse.  Avoid taking birth control pills.  Talk to your health care provider about the risks of taking birth control pills if you are over 35 years old, smoke, get migraines, or have ever had a blood clot.  Get evaluated for sleep  disorders (sleep apnea).  Talk to your health care provider about getting a sleep evaluation if you snore a lot or have excessive sleepiness.  Take medicines only as directed by your health care provider.  For some people, aspirin or blood thinners (anticoagulants) are helpful in reducing the risk of forming abnormal blood clots that can lead to stroke. If you have the irregular heart rhythm of atrial fibrillation, you should be on a blood thinner unless there is a good reason you cannot take them.  Understand all your medicine instructions.  Make sure that other conditions (such as anemia or atherosclerosis) are addressed. SEEK IMMEDIATE MEDICAL CARE IF:   You have sudden weakness or numbness of the face, arm, or leg, especially on one side of the body.  Your face or eyelid droops to one side.  You have sudden confusion.  You have trouble speaking (aphasia) or understanding.  You have sudden trouble seeing in one or both eyes.  You have sudden trouble walking.  You have dizziness.  You have a loss of balance or coordination.  You have a sudden, severe headache with no known cause.  You have new chest pain or an irregular heartbeat. Any of these symptoms may represent a serious problem that is an emergency. Do not wait to see if the symptoms will go away. Get medical help at once. Call your local emergency services (911 in [place] Do not drive yourself to the hospital. Document Released: 01/26/2004 Document Revised: 05/04/2013 Document Reviewed: 06/20/2012 [employer] Patient Information 2015 [employer]. This information is not intended to replace advice given   to you by your health care provider. Make sure you discuss any questions you have with your health care provider.  

## 2013-12-08 NOTE — Progress Notes (Signed)
Established Carotid Patient   History of Present Illness  Jerome Garcia is a 80 y.o. male patient of Dr. Donnetta Hutching who presents today for followup of asymptomatic extracranial cerebrovascular occlusive disease. He has a known occlusion of the right internal carotid artery.   Patient has not had previous carotid artery intervention.  The patient denies any history of TIA or stroke symptoms, specifically the patient denies a history of amaurosis fugax or monocular blindness, denies a history unilateral  of facial drooping, denies a history of hemiplegia, and denies a history of receptive or expressive aphasia.   Pt denies claudication symptoms with walking, denies non healing wounds.  The patient denies New Medical or Surgical History.  Pt Diabetic: No Pt smoker: former smoker, quit in the 1980's  Pt meds include: Statin : Yes ASA: Yes Other anticoagulants/antiplatelets: no   Past Medical History  Diagnosis Date  . Hx of bladder cancer   . HLD (hyperlipidemia)   . Trifascicular block     history  . Hypothyroidism   . Carotid artery stenosis     left carotid bruit  . Benign prostatic hypertrophy   . Gastric outlet obstruction 06/2007    history  . Pacemaker   . Cancer     Bladder Cancer    Social History History  Substance Use Topics  . Smoking status: Former Smoker -- 30 years    Types: Cigarettes    Quit date: 01/01/1978  . Smokeless tobacco: Never Used  . Alcohol Use: No    Family History Family History  Problem Relation Age of Onset  . Throat cancer Father     in his 58's  . Cancer Father   . Lung cancer Mother   . Cancer Mother   . Other Brother     history of oral cancer  . Heart attack Sister   . Heart disease Sister     After 1 yrs of age  . Dementia Sister   . Cancer Brother     Surgical History Past Surgical History  Procedure Laterality Date  . Pacemaker insertion  03/2007    permanent transvenous, [employer]  . Hemorroidectomy    .  Orchiectomy    . Gastrojejunostomy  06/2007  . Esophagogastroduodenoscopy  07/2008    No Known Allergies  Current Outpatient Prescriptions  Medication Sig Dispense Refill  . aspirin 81 MG tablet Take 81 mg by mouth daily.      . Calcium Citrate-Vitamin D (CITRACAL + D PO) Take 2 tablets by mouth daily.     Marland Kitchen levothyroxine (SYNTHROID, LEVOTHROID) 175 MCG tablet TAKE 1 TABLET (175 MCG TOTAL) DAILY 90 tablet 3  . Multiple Vitamin (MULTIVITAMIN) tablet Take 1 tablet by mouth daily.      . Multiple Vitamins-Minerals (ICAPS PO) Take by mouth daily.      . simvastatin (ZOCOR) 20 MG tablet TAKE 1 TABLET AT BEDTIME 90 tablet 6  . vitamin E 400 UNIT capsule Take 400 Units by mouth daily.       No current facility-administered medications for this visit.    Review of Systems : See HPI for pertinent positives and negatives.  Physical Examination  Filed Vitals:   12/08/13 1137 12/08/13 1140  BP: 138/81 145/85  Pulse: 69 68  Resp:  16  Height:  5\' 10"  (1.778 m)  Weight:  148 lb (67.132 kg)  SpO2:  99%   Body mass index is 21.24 kg/(m^2).  General: WDWN male in NAD GAIT: normal Eyes: PERRLA  Pulmonary:  Non-labored, CTAB, negative  Rales, negative rhonchi, & negative wheezing.  Cardiac: regular rhythm, no detected murmur.  VASCULAR EXAM Carotid Bruits Right Left   Negative Negative    Aorta is not palpable. Radial pulses are 2+ palpable and equal.                                                                                                                            LE Pulses Right Left       POPLITEAL  not palpable   not palpable       POSTERIOR TIBIAL  not palpable   not palpable        DORSALIS PEDIS      ANTERIOR TIBIAL not palpable  not palpable     Gastrointestinal: soft, nontender, BS WNL, no r/g,  no palpated masses.  Musculoskeletal: Negative muscle atrophy/wasting. M/S 5/5 throughout, Extremities without ischemic changes.  Neurologic: A&O X 3; Appropriate  Affect,  Speech is normal CN 2-12 intact except, wearing hearing aids, is slightly hard of hearing, Pain and light touch intact in extremities, Motor exam as listed above.   Non-Invasive Vascular Imaging CAROTID DUPLEX 12/08/2013   CEREBROVASCULAR DUPLEX EVALUATION    INDICATION: Carotid disease    PREVIOUS INTERVENTION(S):     DUPLEX EXAM:     RIGHT  LEFT  Peak Systolic Velocities (cm/s) End Diastolic Velocities (cm/s) Plaque LOCATION Peak Systolic Velocities (cm/s) End Diastolic Velocities (cm/s) Plaque  48 0  CCA PROXIMAL 54 12 HT  34   CCA MID 45 12   31 4   CCA DISTAL 41 13 HT  73 11  ECA 183 32 HT  Occluded  HT ICA PROXIMAL 211 50 HT  Occluded  HT ICA MID 107 28   Occluded  HT ICA DISTAL 62 21     Not Calculated ICA / CCA Ratio (PSV) 5.1  Antegrade Vertebral Flow Antegrade   Brachial Systolic Pressure (mmHg)   Multiphasic (subclavian artery) Brachial Artery Waveforms Multiphasic (subclavian artery)    Plaque Morphology:  HM = Homogeneous, HT = Heterogeneous, CP = Calcific Plaque, SP = Smooth Plaque, IP = Irregular Plaque     ADDITIONAL FINDINGS: . No significant stenosis of the right external or bilateral common carotid arteries. . Left external carotid artery stenosis noted.    IMPRESSION: 1. Known occlusion of the right internal carotid artery noted. 2. Doppler velocities suggest a high-end 40-59% stenosis of the left proximal internal carotid artery which is may be partially due to compensatory flow.    Compared to the previous exam:  No significant change noted when compared to the previous exam on 12/09/12.       Assessment: Jerome Garcia is a 80 y.o. male who presents with asymptomatic known occlusion of the right internal carotid artery and high-end 40-59% stenosis of the left proximal internal carotid artery which is may be partially due to compensatory flow. No significant change noted when compared to the  previous exam on 12/09/12.    Plan: Follow-up in  1 year with Carotid Duplex.  I discussed in depth with the patient the nature of atherosclerosis, and emphasized the importance of maximal medical management including strict control of blood pressure, blood glucose, and lipid levels, obtaining regular exercise, and continued cessation of smoking.  The patient is aware that without maximal medical management the underlying atherosclerotic disease process will progress, limiting the benefit of any interventions. The patient was given information about stroke prevention and what symptoms should prompt the patient to seek immediate medical care. Thank you for allowing [place] to participate in this patient's care.  Clemon Chambers, RN, MSN, FNP-C [facility] Office: [phone]  Clinic Physician: Early  12/08/2013 12:21 PM

## 2014-01-05 ENCOUNTER — Other Ambulatory Visit: Payer: Self-pay | Admitting: Internal Medicine

## 2014-01-27 ENCOUNTER — Ambulatory Visit (INDEPENDENT_AMBULATORY_CARE_PROVIDER_SITE_OTHER): Payer: Medicare Other | Admitting: *Deleted

## 2014-01-27 DIAGNOSIS — I440 Atrioventricular block, first degree: Secondary | ICD-10-CM | POA: Diagnosis not present

## 2014-01-27 LAB — MDC_IDC_ENUM_SESS_TYPE_INCLINIC
Brady Statistic RV Percent Paced: 100 %
Implantable Pulse Generator Model: 1297
Implantable Pulse Generator Serial Number: 316920
Lead Channel Impedance Value: 550 Ohm
Lead Channel Pacing Threshold Amplitude: 0.3 V
Lead Channel Pacing Threshold Amplitude: 0.5 V
Lead Channel Pacing Threshold Pulse Width: 0.4 ms
Lead Channel Sensing Intrinsic Amplitude: 3 mV
Lead Channel Setting Pacing Amplitude: 2 V
Lead Channel Setting Sensing Sensitivity: 2.5 mV
MDC IDC MSMT LEADCHNL RA IMPEDANCE VALUE: 540 Ohm
MDC IDC MSMT LEADCHNL RA PACING THRESHOLD PULSEWIDTH: 0.4 ms
MDC IDC SESS DTM: 20160127050000
MDC IDC SET LEADCHNL RV PACING AMPLITUDE: 2.5 V
MDC IDC SET LEADCHNL RV PACING PULSEWIDTH: 0.4 ms
MDC IDC STAT BRADY RA PERCENT PACED: 88 %

## 2014-01-27 NOTE — Progress Notes (Signed)
Pacemaker check in clinic. Normal device function. Thresholds, sensing, impedances consistent with previous measurements. Device programmed to maximize longevity. No mode switch or high ventricular rates noted. Device programmed at appropriate safety margins. Histogram distribution appropriate for patient activity level. Device programmed to optimize intrinsic conduction---turned on AV search hysteresis to 32 search intervals w/ 30% increase (pt's intrinsic AV measures 248ms). Estimated longevity 1.37yrs. ROV w/ Dr. Caryl Comes in 14mo.

## 2014-02-04 ENCOUNTER — Encounter: Payer: Self-pay | Admitting: Internal Medicine

## 2014-02-08 ENCOUNTER — Encounter: Payer: Self-pay | Admitting: Internal Medicine

## 2014-02-28 DIAGNOSIS — R55 Syncope and collapse: Secondary | ICD-10-CM

## 2014-03-20 ENCOUNTER — Other Ambulatory Visit: Payer: Self-pay | Admitting: Internal Medicine

## 2014-04-05 ENCOUNTER — Other Ambulatory Visit: Payer: Self-pay | Admitting: Internal Medicine

## 2014-05-10 DIAGNOSIS — I495 Sick sinus syndrome: Secondary | ICD-10-CM | POA: Diagnosis not present

## 2014-07-02 ENCOUNTER — Encounter: Payer: Medicare Other | Admitting: Internal Medicine

## 2014-07-02 ENCOUNTER — Other Ambulatory Visit: Payer: Self-pay | Admitting: Internal Medicine

## 2014-07-02 NOTE — Telephone Encounter (Signed)
Patient had a physical scheduled, but got lost on his way to the office.  Appointment rescheduled and refill sent.

## 2014-07-29 ENCOUNTER — Encounter: Payer: Self-pay | Admitting: Internal Medicine

## 2014-07-29 ENCOUNTER — Ambulatory Visit (INDEPENDENT_AMBULATORY_CARE_PROVIDER_SITE_OTHER): Payer: Medicare Other | Admitting: Internal Medicine

## 2014-07-29 VITALS — BP 112/72 | HR 65 | Ht 72.0 in | Wt 142.2 lb

## 2014-07-29 DIAGNOSIS — I453 Trifascicular block: Secondary | ICD-10-CM

## 2014-07-29 DIAGNOSIS — Z45018 Encounter for adjustment and management of other part of cardiac pacemaker: Secondary | ICD-10-CM | POA: Diagnosis not present

## 2014-07-29 NOTE — Progress Notes (Signed)
Patient Care Team: Marletta Lor, MD as PCP - General Lelon Perla, MD (Cardiology)   HPI  Jerome Garcia is a 81 y.o. male seen in followup for a [facility] [employer] pacemaker implanted for syncope and trifascicular block.  He has no specific complaints of dizziness. He denies chest pain peripheral edema.  He complains of his memory a little bit  There is no family history of aneurmal disease.     Past Medical History  Diagnosis Date  . Hx of bladder cancer   . HLD (hyperlipidemia)   . Trifascicular block     history  . Hypothyroidism   . Carotid artery stenosis     left carotid bruit  . Benign prostatic hypertrophy   . Gastric outlet obstruction 06/2007    history  . Pacemaker   . Cancer     Bladder Cancer    Past Surgical History  Procedure Laterality Date  . Pacemaker insertion  03/2007    permanent transvenous, [employer]  . Hemorroidectomy    . Orchiectomy    . Gastrojejunostomy  06/2007  . Esophagogastroduodenoscopy  07/2008    Current Outpatient Prescriptions  Medication Sig Dispense Refill  . aspirin 81 MG tablet Take 81 mg by mouth daily.      . Calcium Citrate-Vitamin D (CITRACAL + D PO) Take 2 tablets by mouth daily.     Marland Kitchen levothyroxine (SYNTHROID, LEVOTHROID) 175 MCG tablet TAKE 1 TABLET DAILY (NEED PHYSICAL) (Patient taking differently: TAKE 1 TABLET BY MOUTH DAILY (NEED PHYSICAL)) 90 tablet 0  . Multiple Vitamin (MULTIVITAMIN) tablet Take 1 tablet by mouth daily.      . Multiple Vitamins-Minerals (ICAPS PO) Take 2 capsules by mouth daily.     . simvastatin (ZOCOR) 20 MG tablet Take 20 mg by mouth at bedtime.    . vitamin E 400 UNIT capsule Take 400 Units by mouth daily.       No current facility-administered medications for this visit.    No Known Allergies  Review of Systems negative except from HPI and PMH  Physical Exam BP 112/72 mmHg  Pulse 65  Ht 6' (1.829 m)  Wt 142 lb 3.2 oz (64.501 kg)  BMI 19.28 kg/m2 Repeat BP 120  sys Well developed and well nourished in no acute distress HENT normal E scleral and icterus clear Neck Supple JVP flat; carotids brisk and full Clear to ausculation Regular rate and rhythm, no murmurs gallops or rub Soft with active bowel sounds; is a broad abdominal pulsating mass No clubbing cyanosis none Edema Alert and oriented, grossly normal motor and sensory function Skin Warm and Dry  Electrocardiogram today demonstrates AV pacing at approximately 300 ms with some degree of pseudofusion Assessment and  Plan  Syncope  Trifascicular block   peripheral vascular disease with AAA repair and internal carotid stenosis  Pacemaker [employer] The patient's device was interrogated.  The information was reviewed. No changes were made in the programming.    No recurrent syncope  Continue current meds

## 2014-07-29 NOTE — Patient Instructions (Signed)
Medication Instructions:  Your physician recommends that you continue on your current medications as directed. Please refer to the Current Medication list given to you today.  Labwork: None ordered  Testing/Procedures: None ordered  Follow-Up: Remote monitoring is used to monitor your Pacemaker of ICD from home. This monitoring reduces the number of office visits required to check your device to one time per year. It allows [place] to keep an eye on the functioning of your device to ensure it is working properly. You are scheduled for a device check from home on 10/28/14. You may send your transmission at any time that day. If you have a wireless device, the transmission will be sent automatically. After your physician reviews your transmission, you will receive a postcard with your next transmission date.  Your physician wants you to follow-up in: 1 year with Chanetta Marshall, NP.  You will receive a reminder letter in the mail two months in advance. If you don't receive a letter, please call our office to schedule the follow-up appointment.  Any Other Special Instructions Will Be Listed Below (If Applicable). Thank you for choosing [facility]!!

## 2014-08-09 DIAGNOSIS — I495 Sick sinus syndrome: Secondary | ICD-10-CM | POA: Diagnosis not present

## 2014-09-16 ENCOUNTER — Other Ambulatory Visit: Payer: Self-pay | Admitting: Internal Medicine

## 2014-09-24 ENCOUNTER — Encounter: Payer: Self-pay | Admitting: Internal Medicine

## 2014-09-24 ENCOUNTER — Ambulatory Visit (INDEPENDENT_AMBULATORY_CARE_PROVIDER_SITE_OTHER): Payer: Medicare Other | Admitting: Internal Medicine

## 2014-09-24 VITALS — BP 120/80 | HR 62 | Temp 98.0°F | Resp 18 | Ht 72.0 in | Wt 140.0 lb

## 2014-09-24 DIAGNOSIS — E785 Hyperlipidemia, unspecified: Secondary | ICD-10-CM

## 2014-09-24 DIAGNOSIS — Z23 Encounter for immunization: Secondary | ICD-10-CM | POA: Diagnosis not present

## 2014-09-24 DIAGNOSIS — I480 Paroxysmal atrial fibrillation: Secondary | ICD-10-CM

## 2014-09-24 DIAGNOSIS — Z0000 Encounter for general adult medical examination without abnormal findings: Secondary | ICD-10-CM | POA: Diagnosis not present

## 2014-09-24 DIAGNOSIS — H9193 Unspecified hearing loss, bilateral: Secondary | ICD-10-CM | POA: Diagnosis not present

## 2014-09-24 DIAGNOSIS — E039 Hypothyroidism, unspecified: Secondary | ICD-10-CM | POA: Diagnosis not present

## 2014-09-24 DIAGNOSIS — Z8551 Personal history of malignant neoplasm of bladder: Secondary | ICD-10-CM | POA: Diagnosis not present

## 2014-09-24 LAB — CBC WITH DIFFERENTIAL/PLATELET
Basophils Absolute: 0 10*3/uL (ref 0.0–0.1)
Basophils Relative: 0.7 % (ref 0.0–3.0)
Eosinophils Absolute: 0.1 10*3/uL (ref 0.0–0.7)
Eosinophils Relative: 2 % (ref 0.0–5.0)
HEMATOCRIT: 46 % (ref 39.0–52.0)
Hemoglobin: 15.1 g/dL (ref 13.0–17.0)
LYMPHS ABS: 1.7 10*3/uL (ref 0.7–4.0)
LYMPHS PCT: 23 % (ref 12.0–46.0)
MCHC: 32.9 g/dL (ref 30.0–36.0)
MCV: 93.1 fl (ref 78.0–100.0)
MONOS PCT: 10.9 % (ref 3.0–12.0)
Monocytes Absolute: 0.8 10*3/uL (ref 0.1–1.0)
NEUTROS PCT: 63.4 % (ref 43.0–77.0)
Neutro Abs: 4.6 10*3/uL (ref 1.4–7.7)
PLATELETS: 186 10*3/uL (ref 150.0–400.0)
RBC: 4.94 Mil/uL (ref 4.22–5.81)
RDW: 14.7 % (ref 11.5–15.5)
WBC: 7.3 10*3/uL (ref 4.0–10.5)

## 2014-09-24 LAB — COMPREHENSIVE METABOLIC PANEL
ALT: 11 U/L (ref 0–53)
AST: 20 U/L (ref 0–37)
Albumin: 3.9 g/dL (ref 3.5–5.2)
Alkaline Phosphatase: 56 U/L (ref 39–117)
BUN: 29 mg/dL — ABNORMAL HIGH (ref 6–23)
CALCIUM: 9.7 mg/dL (ref 8.4–10.5)
CO2: 32 meq/L (ref 19–32)
Chloride: 104 mEq/L (ref 96–112)
Creatinine, Ser: 1.5 mg/dL (ref 0.40–1.50)
GFR: 46.76 mL/min — AB (ref >60.00)
GLUCOSE: 74 mg/dL (ref 70–99)
POTASSIUM: 4.9 meq/L (ref 3.5–5.1)
Sodium: 142 mEq/L (ref 135–145)
Total Bilirubin: 0.5 mg/dL (ref 0.2–1.2)
Total Protein: 7 g/dL (ref 6.0–8.3)

## 2014-09-24 LAB — POCT URINALYSIS DIPSTICK
Bilirubin, UA: NEGATIVE
Blood, UA: NEGATIVE
GLUCOSE UA: NEGATIVE
KETONES UA: NEGATIVE
Leukocytes, UA: NEGATIVE
Nitrite, UA: NEGATIVE
PH UA: 5.5
Spec Grav, UA: 1.02
Urobilinogen, UA: 0.2

## 2014-09-24 LAB — LIPID PANEL
CHOL/HDL RATIO: 3
Cholesterol: 158 mg/dL (ref 0–200)
HDL: 57.2 mg/dL (ref >39.00)
LDL Cholesterol: 82 mg/dL (ref 0–99)
NonHDL: 101.03
Triglycerides: 93 mg/dL (ref 0.0–149.0)
VLDL: 18.6 mg/dL (ref 0.0–40.0)

## 2014-09-24 LAB — TSH: TSH: 1.41 u[IU]/mL (ref 0.35–4.50)

## 2014-09-24 NOTE — Progress Notes (Signed)
Pre visit review using our clinic review tool, if applicable. No additional management support is needed unless otherwise documented below in the visit note. 

## 2014-09-24 NOTE — Progress Notes (Signed)
Subjective:    Patient ID: Jerome Garcia, male    DOB: [birth year], 81 y.o.   MRN: [id number]  HPI 81  year-old patient who is seen today for an annual exam. He is followed by cardiology status post pacemaker insertion for trifascicular block. He has a history of dyslipidemia carotid artery disease and hypothyroidism. He is doing quite well. No major concerns or complaints. Recent carotid artery Doppler study was stable in December 2015. Negative screening for AAA  Preventive Screening-Counseling & Management  Alcohol-Tobacco  Alcohol drinks/day: 0  Smoking Status: quit  Caffeine-Diet-Exercise  Caffeine Counseling: not indicated; caffeine use is not excessive or problematic  Nutrition Referrals: no  Does Patient Exercise: yes  Type of exercise: walking  Exercise Counseling: to improve exercise regimen  [place] Depression Score: nonapplicable  Depression Counseling: not indicated; screening negative for depression   Allergies (verified):  No Known Drug Allergies   Past History:  Past Medical History:  Bladder CA, hx  Hyperlipidemia  Hypothyroidism  Trifascicular block, hx status post pacemaker insertion Carotid artery stenosis- left carotid bruit ; occluded right ICA Benign prostatic hypertrophy  history of gastric outlet obstruction. June 2009   Past Surgical History:  s/p permanent transvenous pacemaker placement, 3/09  Hemorrhoidectomy  Orchiectomy  colonoscopy 07-2008  status post gastrojejunostomy June 2009- EGD 07-2008   Family History:   father died in his 104s, and throat cancer  mother died at 21 lungs cancer  One brother history of oral cancer and died of suicide death  one sister-s/p MI, dementia (39)   Social History:   Married  Tobacco Use - Former.  Alcohol Use - no  Does Patient Exercise: yes   Medicare wellness:   1. Risk factors based on Past M, S, F history: known coronary artery disease. He is on simvastatin for cholesterol control  2. Physical  Activities: fairly active for age,  but no rigorous exercise regimen .  Still does yard work 3. Depression/mood: none  4. Hearing: moderately impaired. He uses hearing aids  5. ADL's: no restrictions remains fairly active  6. Fall Risk: Moderate fall risk  7. Home Safety: reviewed no concerns  8. Height, weight, &visual acuity: visual acuity 20/25  9. Counseling: more active physical activity discussed  10. Labs ordered based on risk factors: CBC chemistries PSA lipid profile  11. Referral Coordination follow cardiology discussed  12. Care Plan- follow cardiology return office visit here for lab in one year; will schedule bone density  13.  Preventive services will include annual clinical exam with screening lab.  He will have periodic cardiology follow-up with screening for progression of carotid artery disease 14.  Provider list includes cardiology, vascular surgery primary care medicine   Review of Systems  Constitutional: Negative for fever, chills, activity change, appetite change and fatigue.  HENT: Negative for congestion, dental problem, ear pain, hearing loss, mouth sores, rhinorrhea, sinus pressure, sneezing, tinnitus, trouble swallowing and voice change.   Eyes: Negative for photophobia, pain, redness and visual disturbance.  Respiratory: Negative for apnea, cough, choking, chest tightness, shortness of breath and wheezing.   Cardiovascular: Negative for chest pain, palpitations and leg swelling.  Gastrointestinal: Negative for nausea, vomiting, abdominal pain, diarrhea, constipation, blood in stool, abdominal distention, anal bleeding and rectal pain.  Genitourinary: Negative for dysuria, urgency, frequency, hematuria, flank pain, decreased urine volume, discharge, penile swelling, scrotal swelling, difficulty urinating, genital sores and testicular pain.  Musculoskeletal: Negative for myalgias, back pain, joint swelling, arthralgias, gait problem, neck pain  and neck stiffness.   Skin: Negative for color change, rash and wound.  Neurological: Negative for dizziness, tremors, seizures, syncope, facial asymmetry, speech difficulty, weakness, light-headedness, numbness and headaches.  Hematological: Negative for adenopathy. Does not bruise/bleed easily.  Psychiatric/Behavioral: Negative for suicidal ideas, hallucinations, behavioral problems, confusion, sleep disturbance, self-injury, dysphoric mood, decreased concentration and agitation. The patient is not nervous/anxious.    her     Objective:   Physical Exam  Constitutional: He appears well-developed and well-nourished.  Blood pressure 120/70 bilaterally  HENT:  Head: Normocephalic and atraumatic.  Right Ear: External ear normal.  Left Ear: External ear normal.  Nose: Nose normal.  Mouth/Throat: Oropharynx is clear and moist.  Bilateral hearing aids Minimal wax right canal  Eyes: Conjunctivae and EOM are normal. Pupils are equal, round, and reactive to light. No scleral icterus.  Neck: Normal range of motion. Neck supple. No JVD present. No thyromegaly present.  Left carotid bruit Decreased right carotid upstroke  Cardiovascular: Regular rhythm and normal heart sounds.  Exam reveals no gallop and no friction rub.   No murmur heard. Posterior tibial pulses full. Dorsalis pedis pulses not easily palpable  Pulmonary/Chest: Effort normal and breath sounds normal. He exhibits no tenderness.  Pacemaker left upper anterior chest wall area  Abdominal: Soft. Bowel sounds are normal. He exhibits no distension and no mass. There is no tenderness.  Prominent epigastric pulsation  Genitourinary: Penis normal.  Testicular atrophy with single testicle  Musculoskeletal: Normal range of motion. He exhibits no edema or tenderness.  Lymphadenopathy:    He has no cervical adenopathy.  Neurological: He is alert. He has normal reflexes. No cranial nerve deficit. Coordination normal.  Diminished vibratory sensation distally   Skin: Skin is warm and dry. No rash noted.  Psychiatric: He has a normal mood and affect. His behavior is normal.          Assessment & Plan:   Annual exam  Carotid artery stenosis. Continue aggressive risk factor modification.  Follow-up carotid artery Doppler study December 2016  Status post pacemaker for trifascicular block.  Follow-up cardiology Dyslipidemia. Will continue simvastatin and check a lipid profile Hypothyroidism. We'll check a TSH  Preventative health.  Flu vaccine administered  Diminished auditory acuity.  Will schedule audiology follow-up

## 2014-09-24 NOTE — Patient Instructions (Signed)
Limit your sodium (Salt) intake  Cardiology and vascular surgery follow-up    Fat and Cholesterol Control Diet Fat and cholesterol levels in your blood and organs are influenced by your diet. High levels of fat and cholesterol may lead to diseases of the heart, small and large blood vessels, gallbladder, liver, and pancreas. CONTROLLING FAT AND CHOLESTEROL WITH DIET Although exercise and lifestyle factors are important, your diet is key. That is because certain foods are known to raise cholesterol and others to lower it. The goal is to balance foods for their effect on cholesterol and more importantly, to replace saturated and trans fat with other types of fat, such as monounsaturated fat, polyunsaturated fat, and omega-3 fatty acids. On average, a person should consume no more than 15 to 17 g of saturated fat daily. Saturated and trans fats are considered "bad" fats, and they will raise LDL cholesterol. Saturated fats are primarily found in animal products such as meats, butter, and cream. However, that does not mean you need to give up all your favorite foods. Today, there are good tasting, low-fat, low-cholesterol substitutes for most of the things you like to eat. Choose low-fat or nonfat alternatives. Choose round or loin cuts of red meat. These types of cuts are lowest in fat and cholesterol. Chicken (without the skin), fish, veal, and ground [place] breast are great choices. Eliminate fatty meats, such as hot dogs and salami. Even shellfish have little or no saturated fat. Have a 3 oz (85 g) portion when you eat lean meat, poultry, or fish. Trans fats are also called "partially hydrogenated oils." They are oils that have been scientifically manipulated so that they are solid at room temperature resulting in a longer shelf life and improved taste and texture of foods in which they are added. Trans fats are found in stick margarine, some tub margarines, cookies, crackers, and baked goods.  When  baking and cooking, oils are a great substitute for butter. The monounsaturated oils are especially beneficial since it is believed they lower LDL and raise HDL. The oils you should avoid entirely are saturated tropical oils, such as coconut and palm.  Remember to eat a lot from food groups that are naturally free of saturated and trans fat, including fish, fruit, vegetables, beans, grains (barley, rice, couscous, bulgur wheat), and pasta (without cream sauces).  IDENTIFYING FOODS THAT LOWER FAT AND CHOLESTEROL  Soluble fiber may lower your cholesterol. This type of fiber is found in fruits such as apples, vegetables such as broccoli, potatoes, and carrots, legumes such as beans, peas, and lentils, and grains such as barley. Foods fortified with plant sterols (phytosterol) may also lower cholesterol. You should eat at least 2 g per day of these foods for a cholesterol lowering effect.  Read package labels to identify low-saturated fats, trans fat free, and low-fat foods at the supermarket. Select cheeses that have only 2 to 3 g saturated fat per ounce. Use a heart-healthy tub margarine that is free of trans fats or partially hydrogenated oil. When buying baked goods (cookies, crackers), avoid partially hydrogenated oils. Breads and muffins should be made from whole grains (whole-wheat or whole oat flour, instead of "flour" or "enriched flour"). Buy non-creamy canned soups with reduced salt and no added fats.  FOOD PREPARATION TECHNIQUES  Never deep-fry. If you must fry, either stir-fry, which uses very little fat, or use non-stick cooking sprays. When possible, broil, bake, or roast meats, and steam vegetables. Instead of putting butter or margarine on vegetables,  use lemon and herbs, applesauce, and cinnamon (for squash and sweet potatoes). Use nonfat yogurt, salsa, and low-fat dressings for salads.  LOW-SATURATED FAT / LOW-FAT FOOD SUBSTITUTES Meats / Saturated Fat (g)  Avoid: Steak, marbled (3 oz/85 g)  / 11 g  Choose: Steak, lean (3 oz/85 g) / 4 g  Avoid: Hamburger (3 oz/85 g) / 7 g  Choose: Hamburger, lean (3 oz/85 g) / 5 g  Avoid: Ham (3 oz/85 g) / 6 g  Choose: Ham, lean cut (3 oz/85 g) / 2.4 g  Avoid: Chicken, with skin, dark meat (3 oz/85 g) / 4 g  Choose: Chicken, skin removed, dark meat (3 oz/85 g) / 2 g  Avoid: Chicken, with skin, light meat (3 oz/85 g) / 2.5 g  Choose: Chicken, skin removed, light meat (3 oz/85 g) / 1 g Dairy / Saturated Fat (g)  Avoid: Whole milk (1 cup) / 5 g  Choose: Low-fat milk, 2% (1 cup) / 3 g  Choose: Low-fat milk, 1% (1 cup) / 1.5 g  Choose: Skim milk (1 cup) / 0.3 g  Avoid: Hard cheese (1 oz/28 g) / 6 g  Choose: Skim milk cheese (1 oz/28 g) / 2 to 3 g  Avoid: Cottage cheese, 4% fat (1 cup) / 6.5 g  Choose: Low-fat cottage cheese, 1% fat (1 cup) / 1.5 g  Avoid: Ice cream (1 cup) / 9 g  Choose: Sherbet (1 cup) / 2.5 g  Choose: Nonfat frozen yogurt (1 cup) / 0.3 g  Choose: Frozen fruit bar / trace  Avoid: Whipped cream (1 tbs) / 3.5 g  Choose: Nondairy whipped topping (1 tbs) / 1 g Condiments / Saturated Fat (g)  Avoid: Mayonnaise (1 tbs) / 2 g  Choose: Low-fat mayonnaise (1 tbs) / 1 g  Avoid: Butter (1 tbs) / 7 g  Choose: Extra light margarine (1 tbs) / 1 g  Avoid: Coconut oil (1 tbs) / 11.8 g  Choose: Olive oil (1 tbs) / 1.8 g  Choose: Corn oil (1 tbs) / 1.7 g  Choose: Safflower oil (1 tbs) / 1.2 g  Choose: Sunflower oil (1 tbs) / 1.4 g  Choose: Soybean oil (1 tbs) / 2.4 g  Choose: Canola oil (1 tbs) / 1 g Document Released: 12/18/2004 Document Revised: 04/14/2012 Document Reviewed: 03/18/2013 ExitCare Patient Information 2015 [employer], [place]. This information is not intended to replace advice given to you by your health care provider. Make sure you discuss any questions you have with your health care provider.

## 2014-09-29 ENCOUNTER — Other Ambulatory Visit: Payer: Self-pay | Admitting: Internal Medicine

## 2014-10-28 ENCOUNTER — Encounter: Payer: Medicare Other | Admitting: *Deleted

## 2014-10-28 ENCOUNTER — Telehealth: Payer: Self-pay | Admitting: Cardiology

## 2014-10-28 NOTE — Telephone Encounter (Signed)
Pt does [employer] and he will send remote w/ med[employer].

## 2014-11-01 ENCOUNTER — Telehealth: Payer: Self-pay | Admitting: Internal Medicine

## 2014-11-01 NOTE — Telephone Encounter (Signed)
FYI

## 2014-11-01 NOTE — Telephone Encounter (Signed)
Noted  

## 2014-11-01 NOTE — Telephone Encounter (Signed)
Patient Name: Jerome Garcia  DOB: [birth year]    Initial Comment Caller states husband c/o chilling   Nurse Assessment  Nurse: Verlin Fester RN, Stanton Kidney Date/Time (Eastern Time): 11/01/2014 12:35:15 PM  Confirm and document reason for call. If symptomatic, describe symptoms. ---Caller states patient was sitting in the chair and he just got very cold. She warmed up some blanket and put those around him and he is better now. Denies fever or any other symptoms.  Has the patient traveled out of the country within the last 30 days? ---No  Does the patient have any new or worsening symptoms? ---No  Please document clinical information provided and list any resource used. ---Caller instructed per nurses own knowledge that if he is warm now and he doesn't have any symptoms of illness to just watch him and to call back if he gets a fever or starts having any symptoms. She states she is going to go buy a thermometer so she can check his temperature but he doesn't feel like he has a fever.     Guidelines    Guideline Title Affirmed Question Affirmed Notes       Final Disposition User   Clinical Call [place], RN, [facility]

## 2014-11-05 ENCOUNTER — Telehealth: Payer: Self-pay | Admitting: Internal Medicine

## 2014-11-05 NOTE — Telephone Encounter (Signed)
Please see message and advise 

## 2014-11-05 NOTE — Telephone Encounter (Signed)
Pt call to ask if Dr Raliegh Ip will rx him the following med [place]

## 2014-11-05 NOTE — Telephone Encounter (Signed)
Will need office visit to assess cognition to determine appropriateness of Aricept

## 2014-11-08 DIAGNOSIS — R55 Syncope and collapse: Secondary | ICD-10-CM | POA: Diagnosis not present

## 2014-11-08 NOTE — Telephone Encounter (Signed)
Spoke to pt, told him Dr. Raliegh Ip said you will need an office visit to evaluate cognition to determine appropriateness of medication Aricept. Pt verbalized understanding and will call back if he wants to schedule.

## 2014-11-19 ENCOUNTER — Encounter: Payer: Self-pay | Admitting: Family Medicine

## 2014-11-19 ENCOUNTER — Ambulatory Visit (INDEPENDENT_AMBULATORY_CARE_PROVIDER_SITE_OTHER): Payer: Medicare Other | Admitting: Family Medicine

## 2014-11-19 VITALS — BP 118/70 | HR 83 | Temp 97.6°F | Ht 72.0 in | Wt 140.3 lb

## 2014-11-19 DIAGNOSIS — T148 Other injury of unspecified body region: Secondary | ICD-10-CM

## 2014-11-19 DIAGNOSIS — IMO0002 Reserved for concepts with insufficient information to code with codable children: Secondary | ICD-10-CM

## 2014-11-19 NOTE — Progress Notes (Signed)
HPI:  Jerome Garcia is a very pleasant 81 yo here with his wife for and acute visit for:  "boil" -reports noticed a little bump on his thigh -mildly sore, mild pruritis - no other lesions, fevers or malaise  ROS: See pertinent positives and negatives per HPI.  Past Medical History  Diagnosis Date  . Hx of bladder cancer   . HLD (hyperlipidemia)   . Trifascicular block     history  . Hypothyroidism   . Carotid artery stenosis     left carotid bruit  . Benign prostatic hypertrophy   . Gastric outlet obstruction 06/2007    history  . Pacemaker   . Cancer [facility])     Bladder Cancer    Past Surgical History  Procedure Laterality Date  . Pacemaker insertion  03/2007    permanent transvenous, [employer]  . Hemorroidectomy    . Orchiectomy    . Gastrojejunostomy  06/2007  . Esophagogastroduodenoscopy  07/2008    Family History  Problem Relation Age of Onset  . Throat cancer Father     in his 52's  . Cancer Father   . Lung cancer Mother   . Cancer Mother   . Other Brother     history of oral cancer  . Heart attack Sister   . Heart disease Sister     After 35 yrs of age  . Dementia Sister   . Cancer Brother     Social History   Social History  . Marital Status: Married    Spouse Name: N/A  . Number of Children: N/A  . Years of Education: N/A   Social History Main Topics  . Smoking status: Former Smoker -- 30 years    Types: Cigarettes    Quit date: 01/01/1978  . Smokeless tobacco: Never Used  . Alcohol Use: No  . Drug Use: No  . Sexual Activity: Not Asked   Other Topics Concern  . None   Social History Narrative     Current outpatient prescriptions:  .  aspirin 81 MG tablet, Take 81 mg by mouth daily.  , Disp: , Rfl:  .  Calcium Citrate-Vitamin D (CITRACAL + D PO), Take 2 tablets by mouth daily. , Disp: , Rfl:  .  levothyroxine (SYNTHROID, LEVOTHROID) 175 MCG tablet, Take 1 tablet (175 mcg total) by mouth daily before breakfast., Disp: 90  tablet, Rfl: 1 .  Multiple Vitamin (MULTIVITAMIN) tablet, Take 1 tablet by mouth daily.  , Disp: , Rfl:  .  Multiple Vitamins-Minerals (ICAPS PO), Take 2 capsules by mouth daily. , Disp: , Rfl:  .  simvastatin (ZOCOR) 20 MG tablet, TAKE 1 TABLET AT BEDTIME, Disp: 90 tablet, Rfl: 2 .  vitamin E 400 UNIT capsule, Take 400 Units by mouth daily.  , Disp: , Rfl:   EXAM:  Filed Vitals:   11/19/14 1624  BP: 118/70  Pulse: 83  Temp: 97.6 F (36.4 C)    Body mass index is 19.02 kg/(m^2).  GENERAL: vitals reviewed and listed above, alert, oriented, appears well hydrated and in no acute distress  HEENT: atraumatic, conjunttiva clear, no obvious abnormalities on inspection of external nose and ears  NECK: no obvious masses on inspection  SKIN: small blister L inner lower thigh with minimal erythema surrounding lesion  MS: moves all extremities without noticeable abnormality  PSYCH: pleasant and cooperative, no obvious depression or anxiety  ASSESSMENT AND PLAN:  Discussed the following assessment and plan:  Blister of skin without infection  -  query small burn or insect bite, no other lesions seen so doubt other bullous pathology, no other symptoms -advised abx ointment around lesion to prevent infection, follow up if worsening redness, more lesions develop, spreading or other symptoms -Patient advised to return or notify a doctor immediately if symptoms worsen or persist or new concerns arise.  Patient Instructions  -do not scratch or try to pop blister  -apply ointment provided around blister for a few days twice daily  -follow up if worsening, more lesions develop, spreading redness, not healing or any other concerns       Jadrian Bulman R.

## 2014-11-19 NOTE — Progress Notes (Signed)
Pre visit review using our clinic review tool, if applicable. No additional management support is needed unless otherwise documented below in the visit note. 

## 2014-11-19 NOTE — Patient Instructions (Signed)
-  do not scratch or try to pop blister  -apply ointment provided around blister for a few days twice daily  -follow up if worsening, more lesions develop, spreading redness, not healing or any other concerns

## 2014-12-01 ENCOUNTER — Ambulatory Visit (INDEPENDENT_AMBULATORY_CARE_PROVIDER_SITE_OTHER): Payer: Medicare Other | Admitting: Internal Medicine

## 2014-12-01 ENCOUNTER — Encounter: Payer: Self-pay | Admitting: Internal Medicine

## 2014-12-01 VITALS — BP 140/90 | HR 64 | Temp 97.9°F | Resp 18 | Ht 72.0 in | Wt 142.0 lb

## 2014-12-01 DIAGNOSIS — R351 Nocturia: Secondary | ICD-10-CM

## 2014-12-01 DIAGNOSIS — E039 Hypothyroidism, unspecified: Secondary | ICD-10-CM | POA: Diagnosis not present

## 2014-12-01 DIAGNOSIS — R413 Other amnesia: Secondary | ICD-10-CM | POA: Diagnosis not present

## 2014-12-01 DIAGNOSIS — N401 Enlarged prostate with lower urinary tract symptoms: Secondary | ICD-10-CM

## 2014-12-01 MED ORDER — TAMSULOSIN HCL 0.4 MG PO CAPS: 0.4000 mg | ORAL_CAPSULE | Freq: Every day | ORAL | Status: DC

## 2014-12-01 MED ORDER — DONEPEZIL HCL 5 MG PO TABS: 5.0000 mg | ORAL_TABLET | Freq: Every day | ORAL | Status: DC

## 2014-12-01 NOTE — Patient Instructions (Signed)
Return in 3 months for follow-up  

## 2014-12-01 NOTE — Progress Notes (Signed)
Subjective:    Patient ID: Jerome Garcia, male    DOB: [birth year], 81 y.o.   MRN: [id number]  HPI  81 year old patient who has a history of hypothyroidism as well as heart block.  He is status post pacemaker insertion He has 2 complaints today.  One is impaired memory.  He states that he has become much more forgetful  He also complains of nocturia  MMSE performed today with a score of 27 out of 30.  Past Medical History  Diagnosis Date  . Hx of bladder cancer   . HLD (hyperlipidemia)   . Trifascicular block     history  . Hypothyroidism   . Carotid artery stenosis     left carotid bruit  . Benign prostatic hypertrophy   . Gastric outlet obstruction 06/2007    history  . Pacemaker   . Cancer [facility])     Bladder Cancer    Social History   Social History  . Marital Status: Married    Spouse Name: N/A  . Number of Children: N/A  . Years of Education: N/A   Occupational History  . Not on file.   Social History Main Topics  . Smoking status: Former Smoker -- 30 years    Types: Cigarettes    Quit date: 01/01/1978  . Smokeless tobacco: Never Used  . Alcohol Use: No  . Drug Use: No  . Sexual Activity: Not on file   Other Topics Concern  . Not on file   Social History Narrative    Past Surgical History  Procedure Laterality Date  . Pacemaker insertion  03/2007    permanent transvenous, [employer]  . Hemorroidectomy    . Orchiectomy    . Gastrojejunostomy  06/2007  . Esophagogastroduodenoscopy  07/2008    Family History  Problem Relation Age of Onset  . Throat cancer Father     in his 74's  . Cancer Father   . Lung cancer Mother   . Cancer Mother   . Other Brother     history of oral cancer  . Heart attack Sister   . Heart disease Sister     After 76 yrs of age  . Dementia Sister   . Cancer Brother     No Known Allergies  Current Outpatient Prescriptions on File Prior to Visit  Medication Sig Dispense Refill  . aspirin 81 MG tablet Take  81 mg by mouth daily.      . Calcium Citrate-Vitamin D (CITRACAL + D PO) Take 2 tablets by mouth daily.     Marland Kitchen levothyroxine (SYNTHROID, LEVOTHROID) 175 MCG tablet Take 1 tablet (175 mcg total) by mouth daily before breakfast. 90 tablet 1  . Multiple Vitamin (MULTIVITAMIN) tablet Take 1 tablet by mouth daily.      . Multiple Vitamins-Minerals (ICAPS PO) Take 2 capsules by mouth daily.     . simvastatin (ZOCOR) 20 MG tablet TAKE 1 TABLET AT BEDTIME 90 tablet 2  . vitamin E 400 UNIT capsule Take 400 Units by mouth daily.       No current facility-administered medications on file prior to visit.    BP 140/90 mmHg  Pulse 64  Temp(Src) 97.9 F (36.6 C) (Oral)  Resp 18  Ht 6' (1.829 m)  Wt 142 lb (64.411 kg)  BMI 19.25 kg/m2  SpO2 98%     Review of Systems  Constitutional: Negative for fever, chills, appetite change and fatigue.  HENT: Negative for congestion, dental problem, ear pain,  hearing loss, sore throat, tinnitus, trouble swallowing and voice change.   Eyes: Negative for pain, discharge and visual disturbance.  Respiratory: Negative for cough, chest tightness, wheezing and stridor.   Cardiovascular: Negative for chest pain, palpitations and leg swelling.  Gastrointestinal: Negative for nausea, vomiting, abdominal pain, diarrhea, constipation, blood in stool and abdominal distention.  Endocrine: Positive for polyuria.  Genitourinary: Negative for urgency, hematuria, flank pain, discharge, difficulty urinating and genital sores.  Musculoskeletal: Negative for myalgias, back pain, joint swelling, arthralgias, gait problem and neck stiffness.  Skin: Negative for rash.  Neurological: Negative for dizziness, syncope, speech difficulty, weakness, numbness and headaches.  Hematological: Negative for adenopathy. Does not bruise/bleed easily.  Psychiatric/Behavioral: Positive for decreased concentration. Negative for behavioral problems and dysphoric mood. The patient is not  nervous/anxious.        Objective:   Physical Exam  Constitutional: He is oriented to person, place, and time. He appears well-developed and well-nourished. No distress.  Neurological: He is alert and oriented to person, place, and time.  MMSE 27/30          Assessment & Plan:   Memory impairment.  MMSE 27/30.  I believe the screen test underestimates his short-term memory deficits.  When we discussed a medication to help with nocturia, he had forgotten that we had discussed this problem.  Options discussed.  Will start Aricept 5   Nocturia/BPH.  Trial Flomax Hypothyroidism Status post complete heart block  Reassessed.  3 months

## 2014-12-01 NOTE — Progress Notes (Signed)
Pre visit review using our clinic review tool, if applicable. No additional management support is needed unless otherwise documented below in the visit note. 

## 2014-12-08 ENCOUNTER — Encounter: Payer: Self-pay | Admitting: Family

## 2014-12-14 ENCOUNTER — Encounter (HOSPITAL_COMMUNITY): Payer: Medicare Other

## 2014-12-14 ENCOUNTER — Ambulatory Visit: Payer: Self-pay | Admitting: Family

## 2014-12-22 ENCOUNTER — Encounter: Payer: Self-pay | Admitting: Family

## 2014-12-28 ENCOUNTER — Ambulatory Visit (INDEPENDENT_AMBULATORY_CARE_PROVIDER_SITE_OTHER): Payer: Medicare Other | Admitting: Family

## 2014-12-28 ENCOUNTER — Ambulatory Visit (HOSPITAL_COMMUNITY)
Admission: RE | Admit: 2014-12-28 | Discharge: 2014-12-28 | Disposition: A | Discharge status: Home / Self Care | Payer: Medicare Other | Source: Ambulatory Visit | Attending: Family | Admitting: Family

## 2014-12-28 ENCOUNTER — Encounter: Payer: Self-pay | Admitting: Family

## 2014-12-28 VITALS — BP 128/82 | HR 60 | Temp 98.4°F | Resp 16 | Ht 72.0 in | Wt 142.0 lb

## 2014-12-28 DIAGNOSIS — I6523 Occlusion and stenosis of bilateral carotid arteries: Secondary | ICD-10-CM

## 2014-12-28 DIAGNOSIS — E785 Hyperlipidemia, unspecified: Secondary | ICD-10-CM | POA: Insufficient documentation

## 2014-12-28 NOTE — Progress Notes (Signed)
Filed Vitals:   12/28/14 1519 12/28/14 1523 12/28/14 1527 12/28/14 1528  BP: 147/81 146/87 153/85 128/82  Pulse: 69 60 58 60  Temp:  98.4 F (36.9 C)    TempSrc:  Oral    Resp:  16    Height:  6' (1.829 m)    Weight:  142 lb (64.411 kg)    SpO2:  98%

## 2014-12-28 NOTE — Progress Notes (Signed)
Chief Complaint: Extracranial Carotid Artery Stenosis   History of Present Illness  Jerome Garcia is a 81 y.o. male patient of Dr. Donnetta Hutching who presents today for followup of asymptomatic extracranial cerebrovascular occlusive disease. He has a known occlusion of the right internal carotid artery.   Patient has not had previous carotid artery intervention.  The patient denies any history of TIA or stroke symptoms, specifically the patient denies a history of amaurosis fugax or monocular blindness, denies a history unilateral of facial drooping, denies a history of hemiplegia, and denies a history of receptive or expressive aphasia.   Pt denies claudication symptoms with walking, denies non healing wounds.  The patient denies New Medical or Surgical History.  Pt Diabetic: No Pt smoker: former smoker, quit in the 1980's  Pt meds include: Statin : Yes ASA: Yes Other anticoagulants/antiplatelets: no   Past Medical History  Diagnosis Date  . Hx of bladder cancer   . HLD (hyperlipidemia)   . Trifascicular block     history  . Hypothyroidism   . Carotid artery stenosis     left carotid bruit  . Benign prostatic hypertrophy   . Gastric outlet obstruction 06/2007    history  . Pacemaker   . Cancer [facility])     Bladder Cancer    Social History Social History  Substance Use Topics  . Smoking status: Former Smoker -- 30 years    Types: Cigarettes    Quit date: 01/01/1978  . Smokeless tobacco: Never Used  . Alcohol Use: No    Family History Family History  Problem Relation Age of Onset  . Throat cancer Father     in his 37's  . Cancer Father   . Lung cancer Mother   . Cancer Mother   . Other Brother     history of oral cancer  . Heart attack Sister   . Heart disease Sister     After 53 yrs of age  . Dementia Sister   . Cancer Brother     Surgical History Past Surgical History  Procedure Laterality Date  . Pacemaker insertion  03/2007    permanent  transvenous, [employer]  . Hemorroidectomy    . Orchiectomy    . Gastrojejunostomy  06/2007  . Esophagogastroduodenoscopy  07/2008    No Known Allergies  Current Outpatient Prescriptions  Medication Sig Dispense Refill  . aspirin 81 MG tablet Take 81 mg by mouth daily.      . Calcium Citrate-Vitamin D (CITRACAL + D PO) Take 2 tablets by mouth daily.     Marland Kitchen donepezil (ARICEPT) 5 MG tablet Take 1 tablet (5 mg total) by mouth at bedtime. 90 tablet 2  . levothyroxine (SYNTHROID, LEVOTHROID) 175 MCG tablet Take 1 tablet (175 mcg total) by mouth daily before breakfast. 90 tablet 1  . Multiple Vitamin (MULTIVITAMIN) tablet Take 1 tablet by mouth daily.      . Multiple Vitamins-Minerals (ICAPS PO) Take 2 capsules by mouth daily.     . simvastatin (ZOCOR) 20 MG tablet TAKE 1 TABLET AT BEDTIME 90 tablet 2  . tamsulosin (FLOMAX) 0.4 MG CAPS capsule Take 1 capsule (0.4 mg total) by mouth daily. 30 capsule 3  . vitamin E 400 UNIT capsule Take 400 Units by mouth daily.       No current facility-administered medications for this visit.    Review of Systems : See HPI for pertinent positives and negatives.  Physical Examination  Filed Vitals:   12/28/14 1519  12/28/14 1523 12/28/14 1527 12/28/14 1528  BP: 147/81 146/87 153/85 128/82  Pulse: 69 60 58 60  Temp:  98.4 F (36.9 C)    TempSrc:  Oral    Resp:  16    Height:  6' (1.829 m)    Weight:  142 lb (64.411 kg)    SpO2:  98%     Body mass index is 19.25 kg/(m^2).  General: WDWN male in NAD GAIT: normal Eyes: PERRLA Pulmonary: Non-labored, CTAB, no rales, rhonchi, or wheezing.  Cardiac: regular rhythm, no detected murmur.  VASCULAR EXAM Carotid Bruits Right Left   Negative Negative   Aorta is not palpable. Radial pulses are 2+ palpable and equal.      LE Pulses Right Left    POPLITEAL not palpable  not palpable   POSTERIOR TIBIAL not palpable  not palpable    DORSALIS PEDIS  ANTERIOR TIBIAL not palpable  not palpable     Gastrointestinal: soft, nontender, BS WNL, no r/g, no palpated masses.  Musculoskeletal: No muscle atrophy/wasting. M/S 5/5 throughout, Extremities without ischemic changes.  Neurologic: A&O X 3; Appropriate Affect,  Speech is normal CN 2-12 intact except, wearing hearing aids, is hard of hearing, Pain and light touch intact in extremities, Motor exam as listed above.         Non-Invasive Vascular Imaging CAROTID DUPLEX 12/28/2014   CEREBROVASCULAR DUPLEX EVALUATION    INDICATION: Carotid artery disease     PREVIOUS INTERVENTION(S):     DUPLEX EXAM:     RIGHT  LEFT  Peak Systolic Velocities (cm/s) End Diastolic Velocities (cm/s) Plaque LOCATION Peak Systolic Velocities (cm/s) End Diastolic Velocities (cm/s) Plaque  78 0  CCA PROXIMAL 61 11 HT  50 0  CCA MID 62 15   49 7  CCA DISTAL 51 15 HT  130 9  ECA 191 18 HT  Occluded  HT ICA PROXIMAL 181 51 HT  Occluded  HT ICA MID 82 18   Occluded  HT ICA DISTAL 108 26     NA ICA / CCA Ratio (PSV) 2.91  Antegrade  Vertebral Flow Antegrade    Brachial Systolic Pressure (mmHg)   Multiphasic (Subclavian artery) Brachial Artery Waveforms Multiphasic (Subclavian artery)    Plaque Morphology:  HM = Homogeneous, HT = Heterogeneous, CP = Calcific Plaque, SP = Smooth Plaque, IP = Irregular Plaque  ADDITIONAL FINDINGS:     IMPRESSION: Known occlusion of the right internal carotid artery. Left internal carotid artery velocities suggest a 40-59% stenosis; velocities may be influenced by compensatory flow.    Compared to the previous exam:  No significant change in comparison to the last exam on 12/08/2013.      Assessment: Jerome Garcia is a 81 y.o. male who presents today for followup of asymptomatic extracranial cerebrovascular occlusive disease. He has  a known occlusion of the right internal carotid artery. He has no hx of stroke or TIA. Today's carotid duplex suggests known occlusion of the right internal carotid artery. Left internal carotid artery velocities suggest a 40-59% stenosis; velocities may be influenced by compensatory flow. No significant change in comparison to the last exam on 12/08/2013.  Fortunately he does not have DM and quit smoking in the 1980's. He takes a daily ASA and a statin. He remains physically active.   Plan: Follow-up in 1 year with Carotid Duplex scan.   I discussed in depth with the patient the nature of atherosclerosis, and emphasized the importance of maximal medical management including strict control  of blood pressure, blood glucose, and lipid levels, obtaining regular exercise, and continued cessation of smoking.  The patient is aware that without maximal medical management the underlying atherosclerotic disease process will progress, limiting the benefit of any interventions. The patient was given information about stroke prevention and what symptoms should prompt the patient to seek immediate medical care. Thank you for allowing [place] to participate in this patient's care.  Clemon Chambers, RN, MSN, FNP-C [facility] Office: [phone]  Clinic Physician: Kellie Simmering  12/28/2014 3:36 PM

## 2014-12-28 NOTE — Patient Instructions (Signed)
Stroke Prevention Some medical conditions and behaviors are associated with an increased chance of having a stroke. You may prevent a stroke by making healthy choices and managing medical conditions. HOW CAN I REDUCE MY RISK OF HAVING A STROKE?   Stay physically active. Get at least 30 minutes of activity on most or all days.  Do not smoke. It may also be helpful to avoid exposure to secondhand smoke.  Limit alcohol use. Moderate alcohol use is considered to be:  No more than 2 drinks per day for men.  No more than 1 drink per day for nonpregnant women.  Eat healthy foods. This involves:  Eating 5 or more servings of fruits and vegetables a day.  Making dietary changes that address high blood pressure (hypertension), high cholesterol, diabetes, or obesity.  Manage your cholesterol levels.  Making food choices that are high in fiber and low in saturated fat, trans fat, and cholesterol may control cholesterol levels.  Take any prescribed medicines to control cholesterol as directed by your health care provider.  Manage your diabetes.  Controlling your carbohydrate and sugar intake is recommended to manage diabetes.  Take any prescribed medicines to control diabetes as directed by your health care provider.  Control your hypertension.  Making food choices that are low in salt (sodium), saturated fat, trans fat, and cholesterol is recommended to manage hypertension.  Ask your health care provider if you need treatment to lower your blood pressure. Take any prescribed medicines to control hypertension as directed by your health care provider.  If you are 18-39 years of age, have your blood pressure checked every 3-5 years. If you are 40 years of age or older, have your blood pressure checked every year.  Maintain a healthy weight.  Reducing calorie intake and making food choices that are low in sodium, saturated fat, trans fat, and cholesterol are recommended to manage  weight.  Stop drug abuse.  Avoid taking birth control pills.  Talk to your health care provider about the risks of taking birth control pills if you are over 35 years old, smoke, get migraines, or have ever had a blood clot.  Get evaluated for sleep disorders (sleep apnea).  Talk to your health care provider about getting a sleep evaluation if you snore a lot or have excessive sleepiness.  Take medicines only as directed by your health care provider.  For some people, aspirin or blood thinners (anticoagulants) are helpful in reducing the risk of forming abnormal blood clots that can lead to stroke. If you have the irregular heart rhythm of atrial fibrillation, you should be on a blood thinner unless there is a good reason you cannot take them.  Understand all your medicine instructions.  Make sure that other conditions (such as anemia or atherosclerosis) are addressed. SEEK IMMEDIATE MEDICAL CARE IF:   You have sudden weakness or numbness of the face, arm, or leg, especially on one side of the body.  Your face or eyelid droops to one side.  You have sudden confusion.  You have trouble speaking (aphasia) or understanding.  You have sudden trouble seeing in one or both eyes.  You have sudden trouble walking.  You have dizziness.  You have a loss of balance or coordination.  You have a sudden, severe headache with no known cause.  You have new chest pain or an irregular heartbeat. Any of these symptoms may represent a serious problem that is an emergency. Do not wait to see if the symptoms will   go away. Get medical help at once. Call your local emergency services (911 in [place] Do not drive yourself to the hospital.   This information is not intended to replace advice given to you by your health care provider. Make sure you discuss any questions you have with your health care provider.   Document Released: 01/26/2004 Document Revised: 01/08/2014 Document Reviewed:  06/20/2012 Elsevier Interactive Patient Education 2016 [employer]  

## 2015-02-07 DIAGNOSIS — I495 Sick sinus syndrome: Secondary | ICD-10-CM | POA: Diagnosis not present

## 2015-03-02 ENCOUNTER — Ambulatory Visit (INDEPENDENT_AMBULATORY_CARE_PROVIDER_SITE_OTHER): Payer: Medicare Other | Admitting: Internal Medicine

## 2015-03-02 ENCOUNTER — Encounter: Payer: Self-pay | Admitting: Internal Medicine

## 2015-03-02 VITALS — BP 100/70 | HR 71 | Temp 97.8°F | Resp 18 | Ht 72.0 in | Wt 137.0 lb

## 2015-03-02 DIAGNOSIS — E785 Hyperlipidemia, unspecified: Secondary | ICD-10-CM | POA: Diagnosis not present

## 2015-03-02 DIAGNOSIS — I480 Paroxysmal atrial fibrillation: Secondary | ICD-10-CM | POA: Diagnosis not present

## 2015-03-02 DIAGNOSIS — Z950 Presence of cardiac pacemaker: Secondary | ICD-10-CM | POA: Diagnosis not present

## 2015-03-02 DIAGNOSIS — E039 Hypothyroidism, unspecified: Secondary | ICD-10-CM | POA: Diagnosis not present

## 2015-03-02 NOTE — Progress Notes (Signed)
Pre visit review using our clinic review tool, if applicable. No additional management support is needed unless otherwise documented below in the visit note. 

## 2015-03-02 NOTE — Progress Notes (Signed)
Subjective:    Patient ID: Jerome Garcia, male    DOB: [birth year], 82 y.o.   MRN: [id number]  HPI  82 -year-old patient who is seen today for his six-month follow-up.  He has a history of cognitive impairment, hypothyroidism and dyslipidemia.  Continues to do quite well His wife has substituted sherbet for ice cream and his weight is down 5 pounds over the past 6 months he has been fairly active with short daily walks.  No history of falls. No exertional chest pain or shortness of breath. He is status post permanent pacemaker insertion.  Past Medical History  Diagnosis Date  . Hx of bladder cancer   . HLD (hyperlipidemia)   . Trifascicular block     history  . Hypothyroidism   . Carotid artery stenosis     left carotid bruit  . Benign prostatic hypertrophy   . Gastric outlet obstruction 06/2007    history  . Pacemaker   . Cancer [facility])     Bladder Cancer    Social History   Social History  . Marital Status: Married    Spouse Name: N/A  . Number of Children: N/A  . Years of Education: N/A   Occupational History  . Not on file.   Social History Main Topics  . Smoking status: Former Smoker -- 30 years    Types: Cigarettes    Quit date: 01/01/1978  . Smokeless tobacco: Never Used  . Alcohol Use: No  . Drug Use: No  . Sexual Activity: Not on file   Other Topics Concern  . Not on file   Social History Narrative    Past Surgical History  Procedure Laterality Date  . Pacemaker insertion  03/2007    permanent transvenous, [employer]  . Hemorroidectomy    . Orchiectomy    . Gastrojejunostomy  06/2007  . Esophagogastroduodenoscopy  07/2008    Family History  Problem Relation Age of Onset  . Throat cancer Father     in his 69's  . Cancer Father   . Lung cancer Mother   . Cancer Mother   . Other Brother     history of oral cancer  . Heart attack Sister   . Heart disease Sister     After 23 yrs of age  . Dementia Sister   . Cancer Brother     No  Known Allergies  Current Outpatient Prescriptions on File Prior to Visit  Medication Sig Dispense Refill  . aspirin 81 MG tablet Take 81 mg by mouth daily.      . Calcium Citrate-Vitamin D (CITRACAL + D PO) Take 2 tablets by mouth daily.     Marland Kitchen donepezil (ARICEPT) 5 MG tablet Take 1 tablet (5 mg total) by mouth at bedtime. 90 tablet 2  . levothyroxine (SYNTHROID, LEVOTHROID) 175 MCG tablet Take 1 tablet (175 mcg total) by mouth daily before breakfast. 90 tablet 1  . Multiple Vitamin (MULTIVITAMIN) tablet Take 1 tablet by mouth daily.      . Multiple Vitamins-Minerals (ICAPS PO) Take 2 capsules by mouth daily.     . simvastatin (ZOCOR) 20 MG tablet TAKE 1 TABLET AT BEDTIME 90 tablet 2  . tamsulosin (FLOMAX) 0.4 MG CAPS capsule Take 1 capsule (0.4 mg total) by mouth daily. 30 capsule 3  . vitamin E 400 UNIT capsule Take 400 Units by mouth daily.       No current facility-administered medications on file prior to visit.    BP  100/70 mmHg  Pulse 71  Temp(Src) 97.8 F (36.6 C) (Oral)  Resp 18  Ht 6' (1.829 m)  Wt 137 lb (62.143 kg)  BMI 18.58 kg/m2  SpO2 98%     Review of Systems  Constitutional: Positive for unexpected weight change. Negative for fever, chills, appetite change and fatigue.  HENT: Negative for congestion, dental problem, ear pain, hearing loss, sore throat, tinnitus, trouble swallowing and voice change.   Eyes: Negative for pain, discharge and visual disturbance.  Respiratory: Negative for cough, chest tightness, wheezing and stridor.   Cardiovascular: Negative for chest pain, palpitations and leg swelling.  Gastrointestinal: Negative for nausea, vomiting, abdominal pain, diarrhea, constipation, blood in stool and abdominal distention.  Genitourinary: Negative for urgency, hematuria, flank pain, discharge, difficulty urinating and genital sores.  Musculoskeletal: Negative for myalgias, back pain, joint swelling, arthralgias, gait problem and neck stiffness.  Skin:  Negative for rash.  Neurological: Negative for dizziness, syncope, speech difficulty, weakness, numbness and headaches.  Hematological: Negative for adenopathy. Does not bruise/bleed easily.  Psychiatric/Behavioral: Negative for behavioral problems and dysphoric mood. The patient is not nervous/anxious.        Objective:   Physical Exam  Constitutional: He is oriented to person, place, and time. He appears well-developed.  HENT:  Head: Normocephalic.  Right Ear: External ear normal.  Left Ear: External ear normal.  Eyes: Conjunctivae and EOM are normal.  Neck: Normal range of motion.  Cardiovascular: Normal rate, regular rhythm and normal heart sounds.   Pulmonary/Chest: Breath sounds normal.  Abdominal: Bowel sounds are normal.  Musculoskeletal: Normal range of motion. He exhibits no edema or tenderness.  Neurological: He is alert and oriented to person, place, and time.  Psychiatric: He has a normal mood and affect. His behavior is normal.          Assessment & Plan:   Mild cognitive impairment Hypothyroidism Status post permanent pacemaker insertion Dyslipidemia.  Continue statin therapy   Handicap placard dispensed No change in therapy Recheck in 6 months with lab Diet liberalized

## 2015-03-02 NOTE — Patient Instructions (Signed)
It is important that you exercise regularly, at least 20 minutes 3 to 4 times per week.  If you develop chest pain or shortness of breath seek  medical attention.  Return in 6 months for follow-up  

## 2015-03-17 ENCOUNTER — Telehealth: Payer: Self-pay | Admitting: Internal Medicine

## 2015-03-17 NOTE — Telephone Encounter (Signed)
See below

## 2015-03-17 NOTE — Telephone Encounter (Signed)
Patient Name: CASIMIRO BLUM DOB: [birth year] Initial Comment Caller states husband's feet very red on top, asking what can be done Nurse Assessment Nurse: Ronnald Ramp, RN, Miranda Date/Time (Eastern Time): 03/17/2015 1:26:29 PM Confirm and document reason for call. If symptomatic, describe symptoms. You must click the next button to save text entered. ---Caller states the tops of her husband feet are really red since last week. Has the patient traveled out of the country within the last 30 days? ---Not Applicable Does the patient have any new or worsening symptoms? ---Yes Will a triage be completed? ---Yes Related visit to physician within the last 2 weeks? ---No Does the PT have any chronic conditions? (i.e. diabetes, asthma, etc.) ---Yes List chronic conditions. ---Thyroid, High Cholesterol Is this a behavioral health or substance abuse call? ---No Guidelines Guideline Title Affirmed Question Affirmed Notes Rash or Redness - Localized Mild localized rash (all triage questions negative) Final Disposition User Iota, RN, Miranda Disagree/Comply: Leta Baptist

## 2015-03-18 NOTE — Telephone Encounter (Signed)
FYI

## 2015-03-25 ENCOUNTER — Other Ambulatory Visit: Payer: Self-pay | Admitting: Internal Medicine

## 2015-03-26 ENCOUNTER — Other Ambulatory Visit: Payer: Self-pay | Admitting: Internal Medicine

## 2015-05-04 ENCOUNTER — Telehealth: Payer: Self-pay | Admitting: Internal Medicine

## 2015-05-04 NOTE — Telephone Encounter (Signed)
Pt would like to get a tdap due to the other one expired.  Pt would like to get it at Pharm: [facility] [address]

## 2015-05-05 MED ORDER — TETANUS-DIPHTH-ACELL PERTUSSIS 5-2.5-18.5 LF-MCG/0.5 IM SUSP: 0.5000 mL | Freq: Once | INTRAMUSCULAR | Status: DC

## 2015-05-05 NOTE — Telephone Encounter (Signed)
Spoke to pt, told him he is not due for a Tdap had one done 11/02/2011. Pt verbalized understanding. Called [facility] and spoke to Barnabas Lister told him to cancel Rx for Tdap pt had one done already not due to 2023. Barnabas Lister verbalized understanding.

## 2015-06-13 ENCOUNTER — Other Ambulatory Visit: Payer: Self-pay | Admitting: Internal Medicine

## 2015-06-13 NOTE — Telephone Encounter (Signed)
Refill sent to pharmacy.   

## 2015-06-27 ENCOUNTER — Other Ambulatory Visit: Payer: Self-pay | Admitting: Internal Medicine

## 2015-07-13 ENCOUNTER — Encounter: Payer: Medicare Other | Admitting: Nurse Practitioner

## 2015-07-13 DIAGNOSIS — R0989 Other specified symptoms and signs involving the circulatory and respiratory systems: Secondary | ICD-10-CM

## 2015-07-13 NOTE — Progress Notes (Deleted)
Electrophysiology Office Note Date: 07/13/2015  ID:  ZISHAN Garcia, DOB [birth year], MRN [id number]  PCP: Nyoka Cowden, MD Electrophysiologist: Jerome Garcia  CC: Pacemaker follow-up  Jerome Garcia is a 82 y.o. male seen today for Dr Jerome Garcia.  He presents today for routine electrophysiology followup.  Since last being seen in our clinic, the patient reports doing very well.  He denies chest pain, palpitations, dyspnea, PND, orthopnea, nausea, vomiting, dizziness, syncope, edema, weight gain, or early satiety.  Device History: BSX dual chamber PPM implanted 2009 for syncope and trifascicular block   Past Medical History  Diagnosis Date  . Hx of bladder cancer   . HLD (hyperlipidemia)   . Trifascicular block     history  . Hypothyroidism   . Carotid artery stenosis     left carotid bruit  . Benign prostatic hypertrophy   . Gastric outlet obstruction 06/2007    history  . Pacemaker   . Cancer [facility])     Bladder Cancer   Past Surgical History  Procedure Laterality Date  . Pacemaker insertion  03/2007    permanent transvenous, [employer]  . Hemorroidectomy    . Orchiectomy    . Gastrojejunostomy  06/2007  . Esophagogastroduodenoscopy  07/2008    Current Outpatient Prescriptions  Medication Sig Dispense Refill  . aspirin 81 MG tablet Take 81 mg by mouth daily.      . Calcium Citrate-Vitamin D (CITRACAL + D PO) Take 2 tablets by mouth daily.     Marland Kitchen donepezil (ARICEPT) 5 MG tablet Take 1 tablet (5 mg total) by mouth at bedtime. 90 tablet 2  . levothyroxine (SYNTHROID, LEVOTHROID) 175 MCG tablet TAKE 1 TABLET DAILY BEFORE BREAKFAST 90 tablet 1  . Multiple Vitamin (MULTIVITAMIN) tablet Take 1 tablet by mouth daily.      . Multiple Vitamins-Minerals (ICAPS PO) Take 2 capsules by mouth daily.     . simvastatin (ZOCOR) 20 MG tablet TAKE 1 TABLET AT BEDTIME 90 tablet 1  . tamsulosin (FLOMAX) 0.4 MG CAPS capsule TAKE 1 CAPSULE (0.4 MG TOTAL) BY MOUTH DAILY. 30 capsule 5  .  vitamin E 400 UNIT capsule Take 400 Units by mouth daily.       No current facility-administered medications for this visit.    Allergies:   Review of patient's allergies indicates no known allergies.   Social History: Social History   Social History  . Marital Status: Married    Spouse Name: N/A  . Number of Children: N/A  . Years of Education: N/A   Occupational History  . Not on file.   Social History Main Topics  . Smoking status: Former Smoker -- 30 years    Types: Cigarettes    Quit date: 01/01/1978  . Smokeless tobacco: Never Used  . Alcohol Use: No  . Drug Use: No  . Sexual Activity: Not on file   Other Topics Concern  . Not on file   Social History Narrative    Family History: Family History  Problem Relation Age of Onset  . Throat cancer Father     in his 73's  . Cancer Father   . Lung cancer Mother   . Cancer Mother   . Other Brother     history of oral cancer  . Heart attack Sister   . Heart disease Sister     After 41 yrs of age  . Dementia Sister   . Cancer Brother      Review of Systems: All  other systems reviewed and are otherwise negative except as noted above.   Physical Exam: VS:  There were no vitals taken for this visit. , BMI There is no weight on file to calculate BMI.  GEN- The patient is well appearing, alert and oriented x 3 today.   HEENT: normocephalic, atraumatic; sclera clear, conjunctiva pink; hearing intact; oropharynx clear; neck supple, no JVP Lymph- no cervical lymphadenopathy Lungs- Clear to ausculation bilaterally, normal work of breathing.  No wheezes, rales, rhonchi Heart- Regular rate and rhythm, no murmurs, rubs or gallops, PMI not laterally displaced GI- soft, non-tender, non-distended, bowel sounds present, no hepatosplenomegaly Extremities- no clubbing, cyanosis, or edema; DP/PT/radial pulses 2+ bilaterally MS- no significant deformity or atrophy Skin- warm and dry, no rash or lesion; PPM pocket well  healed Psych- euthymic mood, full affect Neuro- strength and sensation are intact  PPM Interrogation- reviewed in detail today,  See PACEART report  EKG:  EKG is not ordered today.  Recent Labs: 09/24/2014: ALT 11; BUN 29*; Creatinine, Ser 1.50; Hemoglobin 15.1; Platelets 186.0; Potassium 4.9; Sodium 142; TSH 1.41   Wt Readings from Last 3 Encounters:  03/02/15 137 lb (62.143 kg)  12/28/14 142 lb (64.411 kg)  12/01/14 142 lb (64.411 kg)     Other studies Reviewed: Additional studies/ records that were reviewed today include: Dr Olin Pia office notes  Assessment and Plan: 1.  Syncope and trifascicular block Normal PPM function See Pace Art report No changes today  2.  Carotid stenosis Followed by [facility] Continue ASA and statin   Current medicines are reviewed at length with the patient today.   The patient does not have concerns regarding his medicines.  The following changes were made today:  none  Labs/ tests ordered today include: none  No orders of the defined types were placed in this encounter.     Disposition:   Follow up with TTM's, device clinic 6 months, Dr Jerome Garcia 1 year    Signed, Chanetta Marshall, NP 07/13/2015 7:18 AM  [address] [place] [address] [phone] (office) [phone] (fax)

## 2015-07-13 NOTE — Progress Notes (Signed)
This encounter was created in error - please disregard.

## 2015-07-15 ENCOUNTER — Telehealth: Payer: Self-pay | Admitting: Internal Medicine

## 2015-07-15 NOTE — Telephone Encounter (Signed)
Wife would like advice on what to do.  pt is eating, not a lot. But pt has become very tired, and does not want to get out of the bed sometimes. Wife states she is willing to take care of him. Would like Dr Raliegh Ip to recommend on how she can give him the help.

## 2015-07-19 NOTE — Telephone Encounter (Signed)
Spoke with patients wife extensively. She stated patient is sleeping a lot but is in no pain and still is involved in activities he enjoys such as reading and doing cross word puzzles. She wanted to make sure it was okay that he is sleeping late, such as 10:00 or 11:00 A.M. When he does get up he bathes and dresses himself. Wife states they go out to eat sometimes and go to church on Sunday. I stated that patient needs to get regular exercise so that he does not get weak and possibly fall or not be able to get around as well. She stated they go for a short walk 3 times a week. I encouraged her to keep him as active as is reasonable to keep his muscles from deteriorating. She expressed gratitude and stated she would encourage more walks. Patient is eating well and seems happy according to wife. We discussed the need for her to bring patient in if any changes such as depression or less activity so we can evaluate.

## 2015-07-19 NOTE — Telephone Encounter (Signed)
Agree with recommendations. Thank you.

## 2015-07-20 ENCOUNTER — Encounter: Payer: Self-pay | Admitting: Internal Medicine

## 2015-07-20 ENCOUNTER — Ambulatory Visit (INDEPENDENT_AMBULATORY_CARE_PROVIDER_SITE_OTHER): Payer: Medicare Other | Admitting: Internal Medicine

## 2015-07-20 VITALS — BP 108/60 | HR 54 | Ht 72.0 in | Wt 132.8 lb

## 2015-07-20 DIAGNOSIS — Z950 Presence of cardiac pacemaker: Secondary | ICD-10-CM | POA: Diagnosis not present

## 2015-07-20 DIAGNOSIS — I453 Trifascicular block: Secondary | ICD-10-CM

## 2015-07-20 DIAGNOSIS — R55 Syncope and collapse: Secondary | ICD-10-CM

## 2015-07-20 NOTE — Patient Instructions (Signed)

## 2015-07-20 NOTE — Progress Notes (Signed)
Patient Care Team: Marletta Lor, MD as PCP - General Lelon Perla, MD (Cardiology)   HPI  Jerome Garcia is a 82 y.o. male seen in followup for a [facility] [employer] pacemaker implanted for syncope and trifascicular block.  He has no specific complaints of dizziness. He denies chest pain peripheral edema.  He complains of his memory a little bit Increasing shortness of breath with exertion; no edema or orthopnea  but with out chest pain.  There is some tension between the pts children and his wife(step mother) as to whether assisted living is appropriate   Past Medical History  Diagnosis Date  . Hx of bladder cancer   . HLD (hyperlipidemia)   . Trifascicular block     history  . Hypothyroidism   . Carotid artery stenosis     left carotid bruit  . Benign prostatic hypertrophy   . Gastric outlet obstruction 06/2007    history  . Pacemaker   . Cancer [facility])     Bladder Cancer    Past Surgical History  Procedure Laterality Date  . Pacemaker insertion  03/2007    permanent transvenous, [employer]  . Hemorroidectomy    . Orchiectomy    . Gastrojejunostomy  06/2007  . Esophagogastroduodenoscopy  07/2008    Current Outpatient Prescriptions  Medication Sig Dispense Refill  . aspirin 81 MG tablet Take 81 mg by mouth daily.      . Calcium Citrate-Vitamin D (CITRACAL + D PO) Take 2 tablets by mouth daily.     Marland Kitchen donepezil (ARICEPT) 5 MG tablet Take 1 tablet (5 mg total) by mouth at bedtime. 90 tablet 2  . levothyroxine (SYNTHROID, LEVOTHROID) 175 MCG tablet Take 175 mcg by mouth daily before breakfast.    . Multiple Vitamin (MULTIVITAMIN) tablet Take 1 tablet by mouth daily.      . Multiple Vitamins-Minerals (ICAPS PO) Take 2 capsules by mouth daily.     . simvastatin (ZOCOR) 20 MG tablet Take 20 mg by mouth at bedtime.    . tamsulosin (FLOMAX) 0.4 MG CAPS capsule TAKE 1 CAPSULE (0.4 MG TOTAL) BY MOUTH DAILY. 30 capsule 5  . vitamin E 400 UNIT capsule Take 400 Units  by mouth daily.       No current facility-administered medications for this visit.    No Known Allergies  Review of Systems negative except from HPI and PMH  Physical Exam BP 108/60 mmHg  Pulse 54  Ht 6' (1.829 m)  Wt 132 lb 12.8 oz (60.238 kg)  BMI 18.01 kg/m2  SpO2 96% Repeat BP 120 sys Well developed and well nourished in no acute distress HENT normal E scleral and icterus clear Neck Supple JVP flat; carotids brisk and full Clear to ausculation Regular rate and rhythm, no murmurs gallops or rub Soft with active bowel sounds; is a broad abdominal pulsating mass No clubbing cyanosis none Edema Alert and oriented, grossly normal motor and sensory function Skin Warm and Dry  Electrocardiogram today demonstrates sinus at 527/14/46 RBBB LAD -89  Assessment and  Plan  Syncope  Trifascicular block  Dementia  Peripheral vascular disease with AAA repair and internal carotid stenosis  Pacemaker [employer] The patient's device was interrogated.  The information was reviewed. No changes were made in the programming.    No recurrent syncope  Continue current meds  Spoke with wife re end of life issues and the absence of a DNR order

## 2015-07-28 LAB — CUP PACEART INCLINIC DEVICE CHECK
Brady Statistic RV Percent Paced: 0 %
Date Time Interrogation Session: 20170719040000
Implantable Lead Implant Date: 20090303
Lead Channel Impedance Value: 590 Ohm
Lead Channel Pacing Threshold Amplitude: 1.3 V
Lead Channel Sensing Intrinsic Amplitude: 12 mV
Lead Channel Setting Pacing Amplitude: 2 V
Lead Channel Setting Pacing Pulse Width: 0.4 ms
Lead Channel Setting Sensing Sensitivity: 2.5 mV
MDC IDC LEAD IMPLANT DT: 20090303
MDC IDC LEAD LOCATION: 753859
MDC IDC LEAD LOCATION: 753860
MDC IDC MSMT LEADCHNL RA IMPEDANCE VALUE: 550 Ohm
MDC IDC MSMT LEADCHNL RA PACING THRESHOLD PULSEWIDTH: 0.4 ms
MDC IDC MSMT LEADCHNL RA SENSING INTR AMPL: 2.8 mV
MDC IDC MSMT LEADCHNL RV PACING THRESHOLD AMPLITUDE: 0.4 V
MDC IDC MSMT LEADCHNL RV PACING THRESHOLD PULSEWIDTH: 0.4 ms
MDC IDC SET LEADCHNL RV PACING AMPLITUDE: 2.5 V
MDC IDC STAT BRADY RA PERCENT PACED: 0 %
Pulse Gen Model: 1297
Pulse Gen Serial Number: 316920

## 2015-08-03 NOTE — Addendum Note (Signed)
Addended by: Freada Bergeron on: 08/03/2015 04:52 PM   Modules accepted: Orders

## 2015-08-21 ENCOUNTER — Other Ambulatory Visit: Payer: Self-pay | Admitting: Internal Medicine

## 2015-09-20 DIAGNOSIS — I495 Sick sinus syndrome: Secondary | ICD-10-CM | POA: Diagnosis not present

## 2015-09-26 ENCOUNTER — Ambulatory Visit (INDEPENDENT_AMBULATORY_CARE_PROVIDER_SITE_OTHER): Payer: Medicare Other | Admitting: Internal Medicine

## 2015-09-26 ENCOUNTER — Encounter: Payer: Self-pay | Admitting: Internal Medicine

## 2015-09-26 VITALS — BP 108/80 | HR 61 | Temp 97.7°F | Resp 16 | Ht 72.0 in | Wt 134.5 lb

## 2015-09-26 DIAGNOSIS — R413 Other amnesia: Secondary | ICD-10-CM

## 2015-09-26 DIAGNOSIS — E038 Other specified hypothyroidism: Secondary | ICD-10-CM

## 2015-09-26 DIAGNOSIS — I480 Paroxysmal atrial fibrillation: Secondary | ICD-10-CM

## 2015-09-26 DIAGNOSIS — E785 Hyperlipidemia, unspecified: Secondary | ICD-10-CM | POA: Diagnosis not present

## 2015-09-26 DIAGNOSIS — Z950 Presence of cardiac pacemaker: Secondary | ICD-10-CM

## 2015-09-26 DIAGNOSIS — Z0000 Encounter for general adult medical examination without abnormal findings: Secondary | ICD-10-CM | POA: Diagnosis not present

## 2015-09-26 LAB — COMPREHENSIVE METABOLIC PANEL
ALT: 16 U/L (ref 0–53)
AST: 20 U/L (ref 0–37)
Albumin: 3.5 g/dL (ref 3.5–5.2)
Alkaline Phosphatase: 60 U/L (ref 39–117)
BUN: 32 mg/dL — AB (ref 6–23)
CO2: 31 meq/L (ref 19–32)
Calcium: 8.7 mg/dL (ref 8.4–10.5)
Chloride: 106 mEq/L (ref 96–112)
Creatinine, Ser: 1.45 mg/dL (ref 0.40–1.50)
GFR: 48.52 mL/min — AB (ref >60.00)
GLUCOSE: 86 mg/dL (ref 70–99)
POTASSIUM: 4.3 meq/L (ref 3.5–5.1)
SODIUM: 142 meq/L (ref 135–145)
TOTAL PROTEIN: 6.5 g/dL (ref 6.0–8.3)
Total Bilirubin: 0.4 mg/dL (ref 0.2–1.2)

## 2015-09-26 LAB — LIPID PANEL
CHOL/HDL RATIO: 2
Cholesterol: 125 mg/dL (ref 0–200)
HDL: 57.4 mg/dL (ref >39.00)
LDL CALC: 49 mg/dL (ref 0–99)
NONHDL: 67.54
TRIGLYCERIDES: 94 mg/dL (ref 0.0–149.0)
VLDL: 18.8 mg/dL (ref 0.0–40.0)

## 2015-09-26 LAB — CBC WITH DIFFERENTIAL/PLATELET
BASOS ABS: 0 10*3/uL (ref 0.0–0.1)
Basophils Relative: 0.5 % (ref 0.0–3.0)
EOS ABS: 0.1 10*3/uL (ref 0.0–0.7)
Eosinophils Relative: 1.8 % (ref 0.0–5.0)
HCT: 41.8 % (ref 39.0–52.0)
Hemoglobin: 13.9 g/dL (ref 13.0–17.0)
LYMPHS PCT: 21.4 % (ref 12.0–46.0)
Lymphs Abs: 1.5 10*3/uL (ref 0.7–4.0)
MCHC: 33.3 g/dL (ref 30.0–36.0)
MCV: 92.1 fl (ref 78.0–100.0)
MONO ABS: 0.7 10*3/uL (ref 0.1–1.0)
Monocytes Relative: 10 % (ref 3.0–12.0)
NEUTROS PCT: 66.3 % (ref 43.0–77.0)
Neutro Abs: 4.6 10*3/uL (ref 1.4–7.7)
PLATELETS: 189 10*3/uL (ref 150.0–400.0)
RBC: 4.53 Mil/uL (ref 4.22–5.81)
RDW: 14.4 % (ref 11.5–15.5)
WBC: 6.9 10*3/uL (ref 4.0–10.5)

## 2015-09-26 LAB — TSH: TSH: 2.89 u[IU]/mL (ref 0.35–4.50)

## 2015-09-26 NOTE — Patient Instructions (Signed)
Limit your sodium (Salt) intake  Please check your blood pressure on a regular basis.  If it is consistently greater than 150/90, please make an office appointment.     It is important that you exercise regularly, at least 20 minutes 3 to 4 times per week.  If you develop chest pain or shortness of breath seek  medical attention. 

## 2015-09-26 NOTE — Progress Notes (Signed)
Pre visit review using our clinic review tool, if applicable. No additional management support is needed unless otherwise documented below in the visit note. 

## 2015-09-26 NOTE — Progress Notes (Signed)
Subjective:    Patient ID: Jerome Garcia, male    DOB: 08/10/1923, 82 y.o.   MRN: [id number]  HPI 82   year-old patient who is seen today for an annual exam. He is followed by cardiology status post pacemaker insertion for trifascicular block.  He has a history of dyslipidemia carotid artery disease and hypothyroidism.  He is doing quite well. No major concerns or complaints. Recent carotid artery Doppler study was stable in December 2015 And December 2016 Negative screening for AAA.  Over the past year, he has slowed down a bit and sleeps months later and has become a bit less active.  He walks about 1 quarter mile daily  Preventive Screening-Counseling & Management  Alcohol-Tobacco  Alcohol drinks/day: 0  Smoking Status: quit  Caffeine-Diet-Exercise  Caffeine Counseling: not indicated; caffeine use is not excessive or problematic  Nutrition Referrals: no  Does Patient Exercise: yes  Type of exercise: walking  Exercise Counseling: to improve exercise regimen  [place] Depression Score: nonapplicable  Depression Counseling: not indicated; screening negative for depression   Allergies (verified):  No Known Drug Allergies   Past History:  Past Medical History:  Bladder CA, hx  Hyperlipidemia  Hypothyroidism  Trifascicular block, hx status post pacemaker insertion Carotid artery stenosis- left carotid bruit ; occluded right ICA Benign prostatic hypertrophy  history of gastric outlet obstruction. June 2009   Past Surgical History:  s/p permanent transvenous pacemaker placement, 3/09  Hemorrhoidectomy  Orchiectomy  colonoscopy 07-2008  status post gastrojejunostomy June 2009- EGD 07-2008   Family History:   father died in his 18s, and throat cancer  mother died at 47 lungs cancer  One brother history of oral cancer and died of suicide death  one sister-s/p MI, dementia (70)   Social History:   Married  Tobacco Use - Former.  Alcohol Use - no  Does Patient Exercise:  yes   Medicare wellness:   1. Risk factors based on Past M, S, F history: known coronary artery disease. He is on simvastatin for cholesterol control  2. Physical Activities: Has slowed down over the past year.  Walks about 1 quarter mile daily.  Sleeps until 10 or 11 each morning 3. Depression/mood: none  4. Hearing: moderately impaired. He uses hearing aids  5. ADL's: no restrictions remains fairly active  6. Fall Risk: Moderate fall risk; one near fall after what sounds like orthostatic dizziness 7. Home Safety: reviewed no concerns  8. Height, weight, &visual acuity: visual acuity 20/25  9. Counseling: more active physical activity discussed  10. Labs ordered based on risk factors: CBC chemistries PSA lipid profile  11. Referral Coordination follow cardiology discussed  12. Care Plan- follow cardiology return office visit here for lab in one year; will schedule bone density  13.  Preventive services will include annual clinical exam with screening lab.  He will have periodic cardiology follow-up with screening for progression of carotid artery disease 14.  Provider list includes cardiology, vascular surgery primary care medicine   Review of Systems  Constitutional: Negative for activity change, appetite change, chills, fatigue and fever.  HENT: Negative for congestion, dental problem, ear pain, hearing loss, mouth sores, rhinorrhea, sinus pressure, sneezing, tinnitus, trouble swallowing and voice change.   Eyes: Negative for photophobia, pain, redness and visual disturbance.  Respiratory: Negative for apnea, cough, choking, chest tightness, shortness of breath and wheezing.   Cardiovascular: Negative for chest pain, palpitations and leg swelling.  Gastrointestinal: Negative for abdominal distention, abdominal pain, anal  bleeding, blood in stool, constipation, diarrhea, nausea, rectal pain and vomiting.  Genitourinary: Negative for decreased urine volume, difficulty urinating,  discharge, dysuria, flank pain, frequency, genital sores, hematuria, penile swelling, scrotal swelling, testicular pain and urgency.  Musculoskeletal: Negative for arthralgias, back pain, gait problem, joint swelling, myalgias, neck pain and neck stiffness.  Skin: Negative for color change, rash and wound.  Neurological: Negative for dizziness, tremors, seizures, syncope, facial asymmetry, speech difficulty, weakness, light-headedness, numbness and headaches.  Hematological: Negative for adenopathy. Does not bruise/bleed easily.  Psychiatric/Behavioral: Negative for agitation, behavioral problems, confusion, decreased concentration, dysphoric mood, hallucinations, self-injury, sleep disturbance and suicidal ideas. The patient is not nervous/anxious.    her     Objective:   Physical Exam  Constitutional: He appears well-developed and well-nourished.  Blood pressure 120/70 bilaterally  HENT:  Head: Normocephalic and atraumatic.  Right Ear: External ear normal.  Left Ear: External ear normal.  Nose: Nose normal.  Mouth/Throat: Oropharynx is clear and moist.  Bilateral hearing aids Minimal wax right canal  Eyes: Conjunctivae and EOM are normal. Pupils are equal, round, and reactive to light. No scleral icterus.  Neck: Normal range of motion. Neck supple. No JVD present. No thyromegaly present.  Left carotid bruit Decreased right carotid upstroke  Cardiovascular: Regular rhythm and normal heart sounds.  Exam reveals no gallop and no friction rub.   No murmur heard. Posterior tibial pulses full. Dorsalis pedis pulses not easily palpable  Pulmonary/Chest: Effort normal and breath sounds normal. He exhibits no tenderness.  Pacemaker left upper anterior chest wall area  Abdominal: Soft. Bowel sounds are normal. He exhibits no distension and no mass. There is no tenderness.  Prominent epigastric pulsation  Genitourinary: Penis normal.  Genitourinary Comments: Testicular atrophy with single  testicle  Musculoskeletal: Normal range of motion. He exhibits no edema or tenderness.  Lymphadenopathy:    He has no cervical adenopathy.  Neurological: He is alert. He has normal reflexes. No cranial nerve deficit. Coordination normal.  Diminished vibratory sensation distally  Skin: Skin is warm and dry. No rash noted.  Psychiatric: He has a normal mood and affect. His behavior is normal.          Assessment & Plan:   Annual exam  Carotid artery stenosis. Continue aggressive risk factor modification.  Follow-up carotid artery Doppler study December 2017  Status post pacemaker for trifascicular block.  Follow-up cardiology Dyslipidemia. Will continue simvastatin and check a lipid profile Hypothyroidism. We'll check a TSH  Preventative health.  Flu vaccine administered.  High fall risk.  Prescription for walker dispensed     Nyoka Cowden

## 2015-09-28 ENCOUNTER — Other Ambulatory Visit: Payer: Self-pay | Admitting: Internal Medicine

## 2015-12-10 ENCOUNTER — Other Ambulatory Visit: Payer: Self-pay | Admitting: Internal Medicine

## 2015-12-22 ENCOUNTER — Encounter: Payer: Self-pay | Admitting: Family

## 2015-12-24 ENCOUNTER — Other Ambulatory Visit: Payer: Self-pay | Admitting: Internal Medicine

## 2015-12-29 ENCOUNTER — Other Ambulatory Visit: Payer: Self-pay

## 2015-12-29 DIAGNOSIS — I6523 Occlusion and stenosis of bilateral carotid arteries: Secondary | ICD-10-CM

## 2016-01-03 ENCOUNTER — Ambulatory Visit: Payer: Medicare Other | Admitting: Family

## 2016-01-03 ENCOUNTER — Encounter (HOSPITAL_COMMUNITY): Payer: Medicare Other

## 2016-01-11 ENCOUNTER — Ambulatory Visit (INDEPENDENT_AMBULATORY_CARE_PROVIDER_SITE_OTHER): Payer: Medicare Other | Admitting: *Deleted

## 2016-01-11 DIAGNOSIS — I440 Atrioventricular block, first degree: Secondary | ICD-10-CM | POA: Diagnosis not present

## 2016-01-11 LAB — CUP PACEART INCLINIC DEVICE CHECK
Brady Statistic RA Percent Paced: 0 %
Brady Statistic RV Percent Paced: 0 %
Implantable Lead Implant Date: 20090303
Implantable Lead Model: 5076
Implantable Lead Model: 5076
Lead Channel Impedance Value: 540 Ohm
Lead Channel Pacing Threshold Amplitude: 0.4 V
Lead Channel Pacing Threshold Pulse Width: 0.4 ms
Lead Channel Pacing Threshold Pulse Width: 0.4 ms
Lead Channel Setting Pacing Amplitude: 2 V
Lead Channel Setting Pacing Amplitude: 2.5 V
MDC IDC LEAD IMPLANT DT: 20090303
MDC IDC LEAD LOCATION: 753859
MDC IDC LEAD LOCATION: 753860
MDC IDC MSMT LEADCHNL RA PACING THRESHOLD AMPLITUDE: 0.5 V
MDC IDC MSMT LEADCHNL RA SENSING INTR AMPL: 2 mV
MDC IDC MSMT LEADCHNL RV IMPEDANCE VALUE: 610 Ohm
MDC IDC MSMT LEADCHNL RV SENSING INTR AMPL: 12 mV — AB
MDC IDC PG IMPLANT DT: 20090303
MDC IDC PG SERIAL: 316920
MDC IDC SESS DTM: 20180110050000
MDC IDC SET LEADCHNL RV PACING PULSEWIDTH: 0.4 ms
MDC IDC SET LEADCHNL RV SENSING SENSITIVITY: 2.5 mV

## 2016-01-11 NOTE — Progress Notes (Signed)
Pacemaker check in clinic. Normal device function. Thresholds, sensing, impedances consistent with previous measurements. Device programmed to maximize longevity. No mode switch or high ventricular rates noted. Device programmed at appropriate safety margins. Histogram distribution appropriate for patient activity level. Device programmed to optimize intrinsic conduction. Estimated longevity 1 year. Patient will follow up with SK in 6 months.

## 2016-01-12 ENCOUNTER — Encounter: Payer: Self-pay | Admitting: Family

## 2016-01-17 ENCOUNTER — Ambulatory Visit (HOSPITAL_COMMUNITY): Admission: RE | Admit: 2016-01-17 | Payer: Medicare Other | Source: Ambulatory Visit

## 2016-01-17 ENCOUNTER — Ambulatory Visit: Payer: Medicare Other | Admitting: Family

## 2016-01-27 ENCOUNTER — Other Ambulatory Visit: Payer: Self-pay | Admitting: Internal Medicine

## 2016-01-30 ENCOUNTER — Encounter (HOSPITAL_COMMUNITY): Payer: Medicare Other

## 2016-02-09 ENCOUNTER — Ambulatory Visit (INDEPENDENT_AMBULATORY_CARE_PROVIDER_SITE_OTHER): Payer: Medicare Other | Admitting: Internal Medicine

## 2016-02-09 ENCOUNTER — Encounter: Payer: Self-pay | Admitting: Internal Medicine

## 2016-02-09 VITALS — BP 120/76 | HR 65 | Temp 97.8°F | Ht 72.0 in | Wt 137.8 lb

## 2016-02-09 DIAGNOSIS — R413 Other amnesia: Secondary | ICD-10-CM

## 2016-02-09 DIAGNOSIS — I480 Paroxysmal atrial fibrillation: Secondary | ICD-10-CM | POA: Diagnosis not present

## 2016-02-09 DIAGNOSIS — E038 Other specified hypothyroidism: Secondary | ICD-10-CM | POA: Diagnosis not present

## 2016-02-09 NOTE — Progress Notes (Signed)
Subjective:    Patient ID: Jerome Garcia, male    DOB: [birth year], 82 y.o.   MRN: [id number]  HPI  83 year old patient who is seen today for his six-month follow-up.  He has a history of cognitive impairment and is on Aricept.  He has hypothyroidism and remains on supplemental medication.   He has a history of6.  Sinus syndrome, atrial fibrillation, and is status post pacemaker. There is been some modest weight gain since his last visit. He has become more unsteady at 83 years of age.  No recent falls  Past Medical History:  Diagnosis Date  . Benign prostatic hypertrophy   . Cancer [facility])    Bladder Cancer  . Carotid artery stenosis    left carotid bruit  . Gastric outlet obstruction 06/2007   history  . HLD (hyperlipidemia)   . Hx of bladder cancer   . Hypothyroidism   . Pacemaker   . Trifascicular block    history     Social History   Social History  . Marital status: Married    Spouse name: N/A  . Number of children: N/A  . Years of education: N/A   Occupational History  . Not on file.   Social History Main Topics  . Smoking status: Former Smoker    Years: 30.00    Types: Cigarettes    Quit date: 01/01/1978  . Smokeless tobacco: Never Used  . Alcohol use No  . Drug use: No  . Sexual activity: Not on file   Other Topics Concern  . Not on file   Social History Narrative  . No narrative on file    Past Surgical History:  Procedure Laterality Date  . ESOPHAGOGASTRODUODENOSCOPY  07/2008  . GASTROJEJUNOSTOMY  06/2007  . HEMORROIDECTOMY    . ORCHIECTOMY    . PACEMAKER INSERTION  03/2007   permanent transvenous, [employer]    Family History  Problem Relation Age of Onset  . Throat cancer Father     in his 67's  . Cancer Father   . Lung cancer Mother   . Cancer Mother   . Other Brother     history of oral cancer  . Heart attack Sister   . Heart disease Sister     After 3 yrs of age  . Dementia Sister   . Cancer Brother     No Known  Allergies  Current Outpatient Prescriptions on File Prior to Visit  Medication Sig Dispense Refill  . aspirin 81 MG tablet Take 81 mg by mouth daily.      . Calcium Citrate-Vitamin D (CITRACAL + D PO) Take 2 tablets by mouth daily.     Marland Kitchen donepezil (ARICEPT) 5 MG tablet TAKE 1 TABLET (5 MG TOTAL) BY MOUTH AT BEDTIME. 90 tablet 0  . levothyroxine (SYNTHROID, LEVOTHROID) 175 MCG tablet TAKE 1 TABLET DAILY BEFORE BREAKFAST 90 tablet 1  . Multiple Vitamin (MULTIVITAMIN) tablet Take 1 tablet by mouth daily.      . Multiple Vitamins-Minerals (ICAPS PO) Take 2 capsules by mouth daily.     . simvastatin (ZOCOR) 20 MG tablet TAKE 1 TABLET AT BEDTIME 90 tablet 1  . tamsulosin (FLOMAX) 0.4 MG CAPS capsule TAKE 1 CAPSULE (0.4 MG TOTAL) BY MOUTH DAILY. 30 capsule 5  . vitamin E 400 UNIT capsule Take 400 Units by mouth daily.       No current facility-administered medications on file prior to visit.     BP 120/76 (BP Location: Right  Arm, Patient Position: Sitting, Cuff Size: Normal)   Pulse 65   Temp 97.8 F (36.6 C) (Oral)   Ht 6' (1.829 m)   Wt 137 lb 12.8 oz (62.5 kg)   SpO2 96%   BMI 18.69 kg/m     Review of Systems  Constitutional: Positive for appetite change and fatigue. Negative for chills and fever.  HENT: Negative for congestion, dental problem, ear pain, hearing loss, sore throat, tinnitus, trouble swallowing and voice change.   Eyes: Negative for pain, discharge and visual disturbance.  Respiratory: Negative for cough, chest tightness, wheezing and stridor.   Cardiovascular: Negative for chest pain, palpitations and leg swelling.  Gastrointestinal: Negative for abdominal distention, abdominal pain, blood in stool, constipation, diarrhea, nausea and vomiting.  Genitourinary: Negative for difficulty urinating, discharge, flank pain, genital sores, hematuria and urgency.  Musculoskeletal: Positive for arthralgias and gait problem. Negative for back pain, joint swelling, myalgias and  neck stiffness.  Skin: Negative for rash.  Neurological: Positive for weakness. Negative for dizziness, syncope, speech difficulty, numbness and headaches.  Hematological: Negative for adenopathy. Does not bruise/bleed easily.  Psychiatric/Behavioral: Negative for behavioral problems and dysphoric mood. The patient is not nervous/anxious.        Objective:   Physical Exam  Constitutional: He is oriented to person, place, and time. He appears well-developed.  Elderly Frail Needs assistance to transfer from a sitting position to the examining table Blood pressure 120/76  HENT:  Head: Normocephalic.  Right Ear: External ear normal.  Left Ear: External ear normal.  Eyes: Conjunctivae and EOM are normal.  Neck: Normal range of motion.  Cardiovascular: Normal rate and normal heart sounds.   Pulmonary/Chest: Breath sounds normal.  Abdominal: Bowel sounds are normal.  Musculoskeletal: Normal range of motion. He exhibits no edema or tenderness.  Neurological: He is alert and oriented to person, place, and time.  Psychiatric: He has a normal mood and affect. His behavior is normal.          Assessment & Plan:   Sick Sinus syndrome status post permanent pacemaker Hypothyroidism Cognitive impairment Dyslipidemia.  Continue statin therapy  Prescription for a 4-point walker dispensed and encouraged No change in medical regimen Follow-up 6 months  [id number],PETER Pilar Plate

## 2016-02-09 NOTE — Patient Instructions (Signed)
It is important that you exercise regularly, at least 20 minutes 3 to 4 times per week.  If you develop chest pain or shortness of breath seek  medical attention.  Use your walker for stabilization  Return in 6 months for follow-up  No change in your medical regimen

## 2016-02-09 NOTE — Progress Notes (Signed)
Pre visit review using our clinic review tool, if applicable. No additional management support is needed unless otherwise documented below in the visit note. 

## 2016-03-06 ENCOUNTER — Encounter (HOSPITAL_COMMUNITY): Payer: Medicare Other

## 2016-03-06 ENCOUNTER — Ambulatory Visit: Payer: Medicare Other | Admitting: Family

## 2016-03-10 ENCOUNTER — Other Ambulatory Visit: Payer: Self-pay | Admitting: Internal Medicine

## 2016-03-26 ENCOUNTER — Other Ambulatory Visit: Payer: Self-pay | Admitting: Internal Medicine

## 2016-03-26 MED ORDER — TAMSULOSIN HCL 0.4 MG PO CAPS: ORAL_CAPSULE | ORAL | 5 refills | Status: DC

## 2016-04-18 ENCOUNTER — Ambulatory Visit: Payer: Medicare Other | Admitting: Family

## 2016-04-18 ENCOUNTER — Ambulatory Visit (HOSPITAL_COMMUNITY): Payer: Medicare Other

## 2016-05-08 ENCOUNTER — Other Ambulatory Visit: Payer: Self-pay | Admitting: Internal Medicine

## 2016-05-08 MED ORDER — TAMSULOSIN HCL 0.4 MG PO CAPS: ORAL_CAPSULE | ORAL | 2 refills | Status: DC

## 2016-05-15 ENCOUNTER — Ambulatory Visit (HOSPITAL_COMMUNITY)
Admission: RE | Admit: 2016-05-15 | Discharge: 2016-05-15 | Disposition: A | Discharge status: Home / Self Care | Payer: Medicare Other | Source: Ambulatory Visit | Attending: Vascular Surgery | Admitting: Vascular Surgery

## 2016-05-15 ENCOUNTER — Ambulatory Visit: Payer: Medicare Other

## 2016-05-15 DIAGNOSIS — I6523 Occlusion and stenosis of bilateral carotid arteries: Secondary | ICD-10-CM

## 2016-05-15 LAB — VAS US CAROTID
LEFT ECA DIAS: -8 cm/s
LEFT VERTEBRAL DIAS: 14 cm/s
LICADSYS: -60 cm/s
Left CCA dist dias: 19 cm/s
Left CCA dist sys: 75 cm/s
Left CCA prox dias: 14 cm/s
Left CCA prox sys: 76 cm/s
Left ICA dist dias: -19 cm/s
RCCAPDIAS: 3 cm/s
RIGHT CCA MID DIAS: 1 cm/s
RIGHT ECA DIAS: -4 cm/s
RIGHT VERTEBRAL DIAS: -6 cm/s
Right CCA prox sys: 62 cm/s

## 2016-05-18 ENCOUNTER — Encounter: Payer: Self-pay | Admitting: Family

## 2016-05-24 ENCOUNTER — Ambulatory Visit (INDEPENDENT_AMBULATORY_CARE_PROVIDER_SITE_OTHER): Payer: Medicare Other | Admitting: Family

## 2016-05-24 ENCOUNTER — Encounter: Payer: Self-pay | Admitting: Family

## 2016-05-24 VITALS — BP 134/82 | HR 63 | Temp 97.0°F | Resp 20 | Ht 72.0 in | Wt 139.0 lb

## 2016-05-24 DIAGNOSIS — I6521 Occlusion and stenosis of right carotid artery: Secondary | ICD-10-CM

## 2016-05-24 DIAGNOSIS — I6522 Occlusion and stenosis of left carotid artery: Secondary | ICD-10-CM

## 2016-05-24 NOTE — Progress Notes (Signed)
Chief Complaint: Follow up Extracranial Carotid Artery Stenosis   History of Present Illness  Jerome Garcia is a 83 y.o. male patient of Dr. Donnetta Hutching who presents today for followup of asymptomatic extracranial cerebrovascular occlusive disease. He has a known occlusion of the right internal carotid artery.   Patient has not had previous carotid artery intervention.  The patient denies any history of TIA or stroke symptoms, specifically the patient denies a history of amaurosis fugax or monocular blindness, denies a history unilateral of facial drooping, denies a history of hemiplegia, and denies a history of receptive or expressive aphasia.   Pt denies claudication symptoms with walking, denies non healing wounds.   Pt Diabetic: No Pt smoker: former smoker, quit in the 1980's  Pt meds include: Statin : Yes ASA: Yes Other anticoagulants/antiplatelets: no    Past Medical History:  Diagnosis Date  . Benign prostatic hypertrophy   . Cancer [facility])    Bladder Cancer  . Carotid artery stenosis    left carotid bruit  . Gastric outlet obstruction 06/2007   history  . HLD (hyperlipidemia)   . Hx of bladder cancer   . Hypothyroidism   . Pacemaker   . Trifascicular block    history    Social History Social History  Substance Use Topics  . Smoking status: Former Smoker    Years: 30.00    Types: Cigarettes    Quit date: 01/01/1978  . Smokeless tobacco: Never Used  . Alcohol use No    Family History Family History  Problem Relation Age of Onset  . Throat cancer Father        in his 66's  . Cancer Father   . Lung cancer Mother   . Cancer Mother   . Heart attack Sister   . Heart disease Sister        After 9 yrs of age  . Cancer Brother   . Other Brother        history of oral cancer  . Dementia Sister     Surgical History Past Surgical History:  Procedure Laterality Date  . ESOPHAGOGASTRODUODENOSCOPY  07/2008  . GASTROJEJUNOSTOMY  06/2007  .  HEMORROIDECTOMY    . ORCHIECTOMY    . PACEMAKER INSERTION  03/2007   permanent transvenous, [employer]    No Known Allergies  Current Outpatient Prescriptions  Medication Sig Dispense Refill  . aspirin 81 MG tablet Take 81 mg by mouth daily.      . Calcium Citrate-Vitamin D (CITRACAL + D PO) Take 2 tablets by mouth daily.     Marland Kitchen donepezil (ARICEPT) 5 MG tablet TAKE 1 TABLET (5 MG TOTAL) BY MOUTH AT BEDTIME. 90 tablet 0  . levothyroxine (SYNTHROID, LEVOTHROID) 175 MCG tablet TAKE 1 TABLET DAILY BEFORE BREAKFAST 90 tablet 1  . Multiple Vitamin (MULTIVITAMIN) tablet Take 1 tablet by mouth daily.      . Multiple Vitamins-Minerals (ICAPS PO) Take 2 capsules by mouth daily.     . simvastatin (ZOCOR) 20 MG tablet TAKE 1 TABLET AT BEDTIME 90 tablet 1  . tamsulosin (FLOMAX) 0.4 MG CAPS capsule TAKE 1 CAPSULE (0.4 MG TOTAL) BY MOUTH DAILY. 90 capsule 2  . vitamin E 400 UNIT capsule Take 400 Units by mouth daily.       No current facility-administered medications for this visit.     Review of Systems : See HPI for pertinent positives and negatives.  Physical Examination  Vitals:   05/24/16 1404  BP: 134/82  Pulse: 63  Resp: 20  Temp: 97 F (36.1 C)  TempSrc: Oral  SpO2: 99%  Weight: 139 lb (63 kg)  Height: 6' (1.829 m)   Body mass index is 18.85 kg/m.  General: WDWN male in NAD GAIT: normal Eyes: PERRLA Pulmonary: Respirations are non-labored, CTAB, no rales, rhonchi, or wheezing.  Cardiac: regular rhythm and rate, no detected murmur.  VASCULAR EXAM Carotid Bruits Right Left   Negative Negative   Aorta is not palpable. Radial pulses are 2+ palpable and equal.      LE Pulses Right Left   POPLITEAL not palpable  not palpable   POSTERIOR TIBIAL  palpable   palpable    DORSALIS PEDIS  ANTERIOR  TIBIAL not palpable  not palpable     Gastrointestinal: soft, nontender, BS WNL, no r/g, no palpated masses.  Musculoskeletal: No muscle atrophy/wasting. M/S 5/5 throughout, Extremities without ischemic changes. Moderate kyphosis.   Neurologic: A&O X 3; Appropriate Affect, Speech is normal, CN 2-12 intact except is hard of hearing, wearing hearing aids.  Pain and light touch intact in extremities, Motor exam as listed above     Assessment: JOHN WILLIAMSEN is a 83 y.o. male who presents today for followup of asymptomatic extracranial cerebrovascular occlusive disease. He has a known occlusion of the right internal carotid artery. He has no hx of stroke or TIA.  Fortunately he does not have DM and quit smoking in the 1980's. He takes a daily ASA and a statin. He remains physically active.   DATA Today's carotid duplex suggests known occlusion of the right internal carotid artery. Left internal carotid artery velocities suggest a 40-59% stenosis; velocities may be influenced by compensatory flow. No significant change in comparison to the last exam on 12/08/2013.    Plan: Follow-up in 18 months with Carotid Duplex scan. Daughter requested return in 32 months; this is reasonable since his extracranial carotid artery stenosis has been stable and he is 30.   I discussed in depth with the patient the nature of atherosclerosis, and emphasized the importance of maximal medical management including strict control of blood pressure, blood glucose, and lipid levels, obtaining regular exercise, and continued cessation of smoking.  The patient is aware that without maximal medical management the underlying atherosclerotic disease process will progress, limiting the benefit of any interventions. The patient was given information about stroke prevention and what symptoms should prompt the patient to seek immediate medical care. Thank you for allowing [place] to participate in this patient's  care.  Clemon Chambers, RN, MSN, FNP-C [facility] Office: [phone]  Clinic Physician: Oneida Alar  05/24/16 2:10 PM

## 2016-05-24 NOTE — Patient Instructions (Signed)
Stroke Prevention Some medical conditions and behaviors are associated with an increased chance of having a stroke. You may prevent a stroke by making healthy choices and managing medical conditions. How can I reduce my risk of having a stroke?  Stay physically active. Get at least 30 minutes of activity on most or all days.  Do not smoke. It may also be helpful to avoid exposure to secondhand smoke.  Limit alcohol use. Moderate alcohol use is considered to be:  No more than 2 drinks per day for men.  No more than 1 drink per day for nonpregnant women.  Eat healthy foods. This involves:  Eating 5 or more servings of fruits and vegetables a day.  Making dietary changes that address high blood pressure (hypertension), high cholesterol, diabetes, or obesity.  Manage your cholesterol levels.  Making food choices that are high in fiber and low in saturated fat, trans fat, and cholesterol may control cholesterol levels.  Take any prescribed medicines to control cholesterol as directed by your health care provider.  Manage your diabetes.  Controlling your carbohydrate and sugar intake is recommended to manage diabetes.  Take any prescribed medicines to control diabetes as directed by your health care provider.  Control your hypertension.  Making food choices that are low in salt (sodium), saturated fat, trans fat, and cholesterol is recommended to manage hypertension.  Ask your health care provider if you need treatment to lower your blood pressure. Take any prescribed medicines to control hypertension as directed by your health care provider.  If you are 18-39 years of age, have your blood pressure checked every 3-5 years. If you are 40 years of age or older, have your blood pressure checked every year.  Maintain a healthy weight.  Reducing calorie intake and making food choices that are low in sodium, saturated fat, trans fat, and cholesterol are recommended to manage  weight.  Stop drug abuse.  Avoid taking birth control pills.  Talk to your health care provider about the risks of taking birth control pills if you are over 35 years old, smoke, get migraines, or have ever had a blood clot.  Get evaluated for sleep disorders (sleep apnea).  Talk to your health care provider about getting a sleep evaluation if you snore a lot or have excessive sleepiness.  Take medicines only as directed by your health care provider.  For some people, aspirin or blood thinners (anticoagulants) are helpful in reducing the risk of forming abnormal blood clots that can lead to stroke. If you have the irregular heart rhythm of atrial fibrillation, you should be on a blood thinner unless there is a good reason you cannot take them.  Understand all your medicine instructions.  Make sure that other conditions (such as anemia or atherosclerosis) are addressed. Get help right away if:  You have sudden weakness or numbness of the face, arm, or leg, especially on one side of the body.  Your face or eyelid droops to one side.  You have sudden confusion.  You have trouble speaking (aphasia) or understanding.  You have sudden trouble seeing in one or both eyes.  You have sudden trouble walking.  You have dizziness.  You have a loss of balance or coordination.  You have a sudden, severe headache with no known cause.  You have new chest pain or an irregular heartbeat. Any of these symptoms may represent a serious problem that is an emergency. Do not wait to see if the symptoms will go away.   Get medical help at once. Call your local emergency services (911 in [place] Do not drive yourself to the hospital. This information is not intended to replace advice given to you by your health care provider. Make sure you discuss any questions you have with your health care provider. Document Released: 01/26/2004 Document Revised: 05/26/2015 Document Reviewed: 06/20/2012 Elsevier  Interactive Patient Education  2017 [employer]  

## 2016-06-07 ENCOUNTER — Other Ambulatory Visit: Payer: Self-pay | Admitting: Internal Medicine

## 2016-06-08 NOTE — Addendum Note (Signed)
Addended by: Lianne Cure A on: 06/08/2016 09:07 AM   Modules accepted: Orders

## 2016-06-21 ENCOUNTER — Other Ambulatory Visit: Payer: Self-pay | Admitting: Internal Medicine

## 2016-07-02 ENCOUNTER — Encounter: Payer: Self-pay | Admitting: Internal Medicine

## 2016-07-02 ENCOUNTER — Ambulatory Visit (INDEPENDENT_AMBULATORY_CARE_PROVIDER_SITE_OTHER): Payer: Medicare Other | Admitting: Internal Medicine

## 2016-07-02 VITALS — BP 128/72 | HR 66 | Temp 97.4°F | Ht 72.0 in | Wt 142.8 lb

## 2016-07-02 DIAGNOSIS — E034 Atrophy of thyroid (acquired): Secondary | ICD-10-CM | POA: Diagnosis not present

## 2016-07-02 DIAGNOSIS — R413 Other amnesia: Secondary | ICD-10-CM | POA: Diagnosis not present

## 2016-07-02 DIAGNOSIS — E785 Hyperlipidemia, unspecified: Secondary | ICD-10-CM | POA: Diagnosis not present

## 2016-07-02 DIAGNOSIS — I480 Paroxysmal atrial fibrillation: Secondary | ICD-10-CM

## 2016-07-02 DIAGNOSIS — Z950 Presence of cardiac pacemaker: Secondary | ICD-10-CM

## 2016-07-02 NOTE — Progress Notes (Signed)
Subjective:    Patient ID: Jerome Garcia, male    DOB: 01/13/1923, 83 y.o.   MRN: [id number]  HPI 83 year old patient who is seen today accompanied by his wife, daughter-in-law and home health assistant. He has done quite well.  There is been some modest weight gain this year.  He has a history of hypothyroidism and is maintained on supplementation.  He has sick sinus syndrome status post pacemaker.  He has mild dementia. Remains on statin therapy  Patient lives with his wife who also has some cognitive issues.  He does receive considerable help from family as well as home care assistance  The patient has been evaluated by a geriatric care manager, Tereasa Coop, RN,  as well as an in-home assessment by Dr. Anthoney Harada  Past Medical History:  Diagnosis Date  . Benign prostatic hypertrophy   . Cancer [facility])    Bladder Cancer  . Carotid artery stenosis    left carotid bruit  . Gastric outlet obstruction 06/2007   history  . HLD (hyperlipidemia)   . Hx of bladder cancer   . Hypothyroidism   . Pacemaker   . Trifascicular block    history     Social History   Social History  . Marital status: Married    Spouse name: N/A  . Number of children: N/A  . Years of education: N/A   Occupational History  . Not on file.   Social History Main Topics  . Smoking status: Former Smoker    Years: 30.00    Types: Cigarettes    Quit date: 01/01/1978  . Smokeless tobacco: Never Used  . Alcohol use No  . Drug use: No  . Sexual activity: Not on file   Other Topics Concern  . Not on file   Social History Narrative  . No narrative on file    Past Surgical History:  Procedure Laterality Date  . ESOPHAGOGASTRODUODENOSCOPY  07/2008  . GASTROJEJUNOSTOMY  06/2007  . HEMORROIDECTOMY    . ORCHIECTOMY    . PACEMAKER INSERTION  03/2007   permanent transvenous, [employer]    Family History  Problem Relation Age of Onset  . Throat cancer Father        in his 58's  . Cancer  Father   . Lung cancer Mother   . Cancer Mother   . Heart attack Sister   . Heart disease Sister        After 90 yrs of age  . Cancer Brother   . Other Brother        history of oral cancer  . Dementia Sister     No Known Allergies  Current Outpatient Prescriptions on File Prior to Visit  Medication Sig Dispense Refill  . aspirin 81 MG tablet Take 81 mg by mouth daily.      . Calcium Citrate-Vitamin D (CITRACAL + D PO) Take 2 tablets by mouth daily.     Marland Kitchen donepezil (ARICEPT) 5 MG tablet TAKE 1 TABLET (5 MG TOTAL) BY MOUTH AT BEDTIME. 90 tablet 0  . levothyroxine (SYNTHROID, LEVOTHROID) 175 MCG tablet TAKE 1 TABLET DAILY BEFORE BREAKFAST 90 tablet 1  . Multiple Vitamin (MULTIVITAMIN) tablet Take 1 tablet by mouth daily.      . Multiple Vitamins-Minerals (ICAPS PO) Take 2 capsules by mouth daily.     . simvastatin (ZOCOR) 20 MG tablet TAKE 1 TABLET AT BEDTIME 90 tablet 1  . tamsulosin (FLOMAX) 0.4 MG CAPS capsule TAKE 1 CAPSULE (  0.4 MG TOTAL) BY MOUTH DAILY. 90 capsule 2  . vitamin E 400 UNIT capsule Take 400 Units by mouth daily.       No current facility-administered medications on file prior to visit.     BP 128/72 (BP Location: Left Arm, Patient Position: Sitting, Cuff Size: Normal)   Pulse 66   Temp 97.4 F (36.3 C) (Oral)   Ht 6' (1.829 m)   Wt 142 lb 12.8 oz (64.8 kg)   SpO2 98%   BMI 19.37 kg/m      Review of Systems  Constitutional: Positive for fatigue. Negative for appetite change, chills and fever.  HENT: Negative for congestion, dental problem, ear pain, hearing loss, sore throat, tinnitus, trouble swallowing and voice change.   Eyes: Negative for pain, discharge and visual disturbance.  Respiratory: Negative for cough, chest tightness, wheezing and stridor.   Cardiovascular: Negative for chest pain, palpitations and leg swelling.  Gastrointestinal: Negative for abdominal distention, abdominal pain, blood in stool, constipation, diarrhea, nausea and  vomiting.  Genitourinary: Negative for difficulty urinating, discharge, flank pain, genital sores, hematuria and urgency.  Musculoskeletal: Positive for gait problem. Negative for arthralgias, back pain, joint swelling, myalgias and neck stiffness.  Skin: Negative for rash.  Neurological: Negative for dizziness, syncope, speech difficulty, weakness, numbness and headaches.  Hematological: Negative for adenopathy. Does not bruise/bleed easily.  Psychiatric/Behavioral: Positive for confusion. Negative for behavioral problems and dysphoric mood. The patient is not nervous/anxious.        Objective:   Physical Exam  Constitutional: He is oriented to person, place, and time. He appears well-developed. No distress.   Mild confusion.  Blood pressure well controlled  HENT:  Head: Normocephalic.  Right Ear: External ear normal.  Left Ear: External ear normal.  Eyes: Conjunctivae and EOM are normal.  Neck: Normal range of motion.  Cardiovascular: Normal rate and normal heart sounds.   Pulmonary/Chest: Breath sounds normal.  Abdominal: Bowel sounds are normal.  Right inguinal hernia noted  Musculoskeletal: Normal range of motion. He exhibits no edema or tenderness.  Neurological: He is alert and oriented to person, place, and time.  Psychiatric: He has a normal mood and affect. His behavior is normal.          Assessment & Plan:   Sick Sinus syndrome status post pacemaker Mild dementia.  His wife also appears to have some cognitive impairment.  Thankfully they have oversight with family and professional assistance.  There has been some concerns about daily compliance with levothyroxin Dyslipidemia.  Continue statin therapy BPH stable  Follow-up 6 months  Jerome Garcia

## 2016-07-02 NOTE — Patient Instructions (Signed)
  No change in your medical regimen.  Please take all your medications faithfully  Return in the fall for follow-up and comprehensive laboratory testing

## 2016-07-23 NOTE — Progress Notes (Signed)
Cardiology Office Note Date:  07/24/2016  Patient ID:  Jerome Garcia, DOB 1923/02/23, MRN [id number] PCP:  Marletta Lor, MD  Cardiologist:  Dr. Stanford Breed Electrophysiologist: Dr. Caryl Comes   Chief Complaint: annual visit  History of Present Illness: Jerome Garcia is a 83 y.o. male with history of syncope/trifasicular block w/PPM, hypothyroidism, HLD, hx of bladder cancer, vascular disease with AAA repair the patient denies this, and carotid disease, comes in today to be seen for Dr. Caryl Comes, last seen by him in July 2017, at that time, no changes were made to therapy, note mentions ongoing discussion regarding possible AFL and code status.  In review of notes, he has some degree of dementia though appears to have very ood family support as well as care giver help  He is accompanied by one of his caregivers who says all in all he seems to be doing fine, no falls, walks independently at home, only uses the cane when outside of the home. She has noted perhaps a littlle generalized slowing.  The patient denies any concerns, feels like he is getting by just fine, denies any dizziness, near syncope or syncope, no CP, palpitations or SOB.  He is observed to ambulate here and does well without SOB, and carries his cane, doesn't actually use it  Device information: BSCi dual chamber PPM, implanted 03/04/07, Dr. Lovena Le, follows w/Dr. Caryl Comes   Past Medical History:  Diagnosis Date  . Benign prostatic hypertrophy   . Cancer [facility])    Bladder Cancer  . Carotid artery stenosis    left carotid bruit  . Gastric outlet obstruction 06/2007   history  . HLD (hyperlipidemia)   . Hx of bladder cancer   . Hypothyroidism   . Pacemaker   . Trifascicular block    history    Past Surgical History:  Procedure Laterality Date  . ESOPHAGOGASTRODUODENOSCOPY  07/2008  . GASTROJEJUNOSTOMY  06/2007  . HEMORROIDECTOMY    . ORCHIECTOMY    . PACEMAKER INSERTION  03/2007   permanent transvenous, [employer]      Current Outpatient Prescriptions  Medication Sig Dispense Refill  . aspirin 81 MG tablet Take 81 mg by mouth daily.      . Calcium Citrate-Vitamin D (CITRACAL + D PO) Take 2 tablets by mouth daily.     Marland Kitchen donepezil (ARICEPT) 5 MG tablet TAKE 1 TABLET (5 MG TOTAL) BY MOUTH AT BEDTIME. 90 tablet 0  . levothyroxine (SYNTHROID, LEVOTHROID) 175 MCG tablet TAKE 1 TABLET DAILY BEFORE BREAKFAST 90 tablet 1  . Multiple Vitamin (MULTIVITAMIN) tablet Take 1 tablet by mouth daily.      . Multiple Vitamins-Minerals (ICAPS PO) Take 2 capsules by mouth daily.     . simvastatin (ZOCOR) 20 MG tablet TAKE 1 TABLET AT BEDTIME 90 tablet 1  . tamsulosin (FLOMAX) 0.4 MG CAPS capsule TAKE 1 CAPSULE (0.4 MG TOTAL) BY MOUTH DAILY. 90 capsule 2  . vitamin E 400 UNIT capsule Take 400 Units by mouth daily.       No current facility-administered medications for this visit.     Allergies:   Patient has no known allergies.   Social History:  The patient  reports that he quit smoking about 38 years ago. His smoking use included Cigarettes. He quit after 30.00 years of use. He has never used smokeless tobacco. He reports that he does not drink alcohol or use drugs.   Family History:  The patient's family history includes Cancer in his brother, father,  and mother; Dementia in his sister; Heart attack in his sister; Heart disease in his sister; Lung cancer in his mother; Other in his brother; Throat cancer in his father.  ROS:  Please see the history of present illness.    All other systems are reviewed and otherwise negative.   PHYSICAL EXAM:  VS:  BP 128/74   Pulse (!) 55   Ht 6' (1.829 m)   Wt 142 lb (64.4 kg)   BMI 19.26 kg/m  BMI: Body mass index is 19.26 kg/m. Well nourished, thin, though well developed, in no acute distress  HEENT: normocephalic, atraumatic  Neck: no JVD, carotid bruits or masses Cardiac:  RRR; 1/6 SM, no rubs, or gallops Lungs:  CTA b/l, no wheezing, rhonchi or rales  Abd: soft,  nontender MS: no deformity or age appropriate atrophy Ext: no edema  Skin: warm and dry, no rash Neuro:  No gross deficits appreciated Psych: euthymic mood, full affect   PPM site is stable, no tethering or discomfort   EKG:  Done today and reviewed by myself shows SB 55bpm, 1st degree AVblock, RBBB, PR 265ms, QRS 115ms, QTc 424ms PPM interrogation done today by industry and reviewed by myself: programmed DDI 30bpm it appears for years, battery longevity estimated to be < 6 months,  A and V pacing events appears similar to last year, V paced events up some  05/15/16: Carotid US 1. Known occlusion f right ICA 2. 40-59% left ICA stenosis 3. >50% left external carotid stenosis  02/24/07: TTE SUMMARY - Overall left ventricular systolic function was normal. Left    ventricular ejection fraction was estimated , range being 55    % to 60 %.  Recent Labs: 09/26/2015: ALT 16; BUN 32; Creatinine, Ser 1.45; Hemoglobin 13.9; Platelets 189.0; Potassium 4.3; Sodium 142; TSH 2.89  09/26/2015: Cholesterol 125; HDL 57.40; LDL Cholesterol 49; Total CHOL/HDL Ratio 2; Triglycerides 94.0; VLDL 18.8   CrCl cannot be calculated (Patient's most recent lab result is older than the maximum 21 days allowed.).   Wt Readings from Last 3 Encounters:  07/24/16 142 lb (64.4 kg)  07/02/16 142 lb 12.8 oz (64.8 kg)  05/24/16 139 lb (63 kg)     Other studies reviewed: Additional studies/records reviewed today include: summarized above  ASSESSMENT AND PLAN:  1. PPM     Has been programmed DDI 30 for the last couple years     No symptoms of bradycardia, no syncope     Battery is nearing ERT  2. PVD w/known occl right ICA     No symptoms     On ASA, statin     C/w Dr. Stanford Breed  Disposition: F/u with device clinic in 3 months, plan for Dr. Caryl Comes in 6 months, sooner pending his battery status  Current medicines are reviewed at length with the patient today.  The patient did not have any concerns  regarding medicines.  Haywood Lasso, PA-C 07/24/2016 1:31 PM     [address] [place] [phone] (office)  [phone] (fax)

## 2016-07-24 ENCOUNTER — Ambulatory Visit (INDEPENDENT_AMBULATORY_CARE_PROVIDER_SITE_OTHER): Payer: Medicare Other | Admitting: Physician Assistant

## 2016-07-24 VITALS — BP 128/74 | HR 55 | Ht 72.0 in | Wt 142.4 lb

## 2016-07-24 DIAGNOSIS — I4891 Unspecified atrial fibrillation: Secondary | ICD-10-CM | POA: Diagnosis not present

## 2016-07-24 DIAGNOSIS — Z950 Presence of cardiac pacemaker: Secondary | ICD-10-CM

## 2016-07-24 DIAGNOSIS — I440 Atrioventricular block, first degree: Secondary | ICD-10-CM

## 2016-07-24 NOTE — Patient Instructions (Addendum)
Medication Instructions:   Your physician recommends that you continue on your current medications as directed. Please refer to the Current Medication list given to you today.    If you need a refill on your cardiac medications before your next appointment, please call your pharmacy.  Labwork: NONE ORDERED  TODAY    Testing/Procedures: NONE ORDERED  TODAY    Follow-Up:  DEVICE CHECK 3 MONTHS WITH DEVICE CLINIC    Your physician wants you to follow-up in:  IN  [place].Marland KitchenYou will receive a reminder letter in the mail two months in advance. If you don't receive a letter, please call our office to schedule the follow-up appointment.      Any Other Special Instructions Will Be Listed Below (If Applicable).

## 2016-07-26 NOTE — Addendum Note (Signed)
Addended by: Claude Manges on: 07/26/2016 08:15 AM   Modules accepted: Orders

## 2016-09-20 ENCOUNTER — Encounter: Payer: Self-pay | Admitting: Internal Medicine

## 2016-10-02 ENCOUNTER — Encounter: Payer: Self-pay | Admitting: Internal Medicine

## 2016-10-02 ENCOUNTER — Ambulatory Visit (INDEPENDENT_AMBULATORY_CARE_PROVIDER_SITE_OTHER): Payer: Medicare Other | Admitting: Internal Medicine

## 2016-10-02 VITALS — BP 126/74 | HR 69 | Temp 97.3°F | Ht 72.0 in | Wt 152.2 lb

## 2016-10-02 DIAGNOSIS — Z950 Presence of cardiac pacemaker: Secondary | ICD-10-CM | POA: Diagnosis not present

## 2016-10-02 DIAGNOSIS — E785 Hyperlipidemia, unspecified: Secondary | ICD-10-CM

## 2016-10-02 DIAGNOSIS — Z23 Encounter for immunization: Secondary | ICD-10-CM | POA: Diagnosis not present

## 2016-10-02 DIAGNOSIS — E039 Hypothyroidism, unspecified: Secondary | ICD-10-CM

## 2016-10-02 DIAGNOSIS — I480 Paroxysmal atrial fibrillation: Secondary | ICD-10-CM | POA: Diagnosis not present

## 2016-10-02 DIAGNOSIS — R413 Other amnesia: Secondary | ICD-10-CM | POA: Diagnosis not present

## 2016-10-02 MED ORDER — LEVOTHYROXINE SODIUM 175 MCG PO TABS: 175.0000 ug | ORAL_TABLET | Freq: Every day | ORAL | 1 refills | Status: DC

## 2016-10-02 MED ORDER — SIMVASTATIN 20 MG PO TABS: 20.0000 mg | ORAL_TABLET | Freq: Every day | ORAL | 1 refills | Status: DC

## 2016-10-02 MED ORDER — DONEPEZIL HCL 5 MG PO TABS: 5.0000 mg | ORAL_TABLET | Freq: Every day | ORAL | 0 refills | Status: DC

## 2016-10-02 MED ORDER — TAMSULOSIN HCL 0.4 MG PO CAPS: ORAL_CAPSULE | ORAL | 2 refills | Status: DC

## 2016-10-02 NOTE — Progress Notes (Signed)
Subjective:    Patient ID: Jerome Garcia, male    DOB: [birth year], 83 y.o.   MRN: [id number]  HPI 83 year old patient who is accompanied by his wife today. He has a history of atrial fibrillation and is status post permanent pacemaker insertion.  He has a history of hypothyroidism.  Remains on statin therapy for dyslipidemia. Denies any cardiopulmonary complaints Since his last visit here 3 months ago.  His weight is up 10 pounds.  He has been on ensure supplements.  Past Medical History:  Diagnosis Date  . Benign prostatic hypertrophy   . Cancer [facility])    Bladder Cancer  . Carotid artery stenosis    left carotid bruit  . Gastric outlet obstruction 06/2007   history  . HLD (hyperlipidemia)   . Hx of bladder cancer   . Hypothyroidism   . Pacemaker   . Trifascicular block    history     Social History   Social History  . Marital status: Married    Spouse name: N/A  . Number of children: N/A  . Years of education: N/A   Occupational History  . Not on file.   Social History Main Topics  . Smoking status: Former Smoker    Years: 30.00    Types: Cigarettes    Quit date: 01/01/1978  . Smokeless tobacco: Never Used  . Alcohol use No  . Drug use: No  . Sexual activity: Not on file   Other Topics Concern  . Not on file   Social History Narrative  . No narrative on file    Past Surgical History:  Procedure Laterality Date  . ESOPHAGOGASTRODUODENOSCOPY  07/2008  . GASTROJEJUNOSTOMY  06/2007  . HEMORROIDECTOMY    . ORCHIECTOMY    . PACEMAKER INSERTION  03/2007   permanent transvenous, [employer]    Family History  Problem Relation Age of Onset  . Throat cancer Father        in his 70's  . Cancer Father   . Lung cancer Mother   . Cancer Mother   . Heart attack Sister   . Heart disease Sister        After 58 yrs of age  . Cancer Brother   . Other Brother        history of oral cancer  . Dementia Sister     No Known Allergies  Current Outpatient  Prescriptions on File Prior to Visit  Medication Sig Dispense Refill  . aspirin 81 MG tablet Take 81 mg by mouth daily.      . Calcium Citrate-Vitamin D (CITRACAL + D PO) Take 2 tablets by mouth daily.     Marland Kitchen donepezil (ARICEPT) 5 MG tablet TAKE 1 TABLET (5 MG TOTAL) BY MOUTH AT BEDTIME. 90 tablet 0  . levothyroxine (SYNTHROID, LEVOTHROID) 175 MCG tablet TAKE 1 TABLET DAILY BEFORE BREAKFAST 90 tablet 1  . Multiple Vitamin (MULTIVITAMIN) tablet Take 1 tablet by mouth daily.      . Multiple Vitamins-Minerals (ICAPS PO) Take 2 capsules by mouth daily.     . simvastatin (ZOCOR) 20 MG tablet TAKE 1 TABLET AT BEDTIME 90 tablet 1  . tamsulosin (FLOMAX) 0.4 MG CAPS capsule TAKE 1 CAPSULE (0.4 MG TOTAL) BY MOUTH DAILY. 90 capsule 2  . vitamin E 400 UNIT capsule Take 400 Units by mouth daily.       No current facility-administered medications on file prior to visit.     BP 126/74 (BP Location: Left Arm, Patient  Position: Sitting, Cuff Size: Normal)   Pulse 69   Temp (!) 97.3 F (36.3 C) (Oral)   Ht 6' (1.829 m)   Wt 152 lb 3.2 oz (69 kg)   SpO2 98%   BMI 20.64 kg/m      Review of Systems  Constitutional: Negative for appetite change, chills, fatigue and fever.  HENT: Negative for congestion, dental problem, ear pain, hearing loss, sore throat, tinnitus, trouble swallowing and voice change.   Eyes: Negative for pain, discharge and visual disturbance.  Respiratory: Negative for cough, chest tightness, wheezing and stridor.   Cardiovascular: Negative for chest pain, palpitations and leg swelling.  Gastrointestinal: Negative for abdominal distention, abdominal pain, blood in stool, constipation, diarrhea, nausea and vomiting.  Genitourinary: Negative for difficulty urinating, discharge, flank pain, genital sores, hematuria and urgency.  Musculoskeletal: Negative for arthralgias, back pain, gait problem, joint swelling, myalgias and neck stiffness.  Skin: Negative for rash.  Neurological:  Negative for dizziness, syncope, speech difficulty, weakness, numbness and headaches.  Hematological: Negative for adenopathy. Does not bruise/bleed easily.  Psychiatric/Behavioral: Positive for confusion and decreased concentration. Negative for behavioral problems and dysphoric mood. The patient is not nervous/anxious.        Objective:   Physical Exam  Constitutional: He is oriented to person, place, and time. He appears well-developed.  HENT:  Head: Normocephalic.  Right Ear: External ear normal.  Left Ear: External ear normal.  Eyes: Conjunctivae and EOM are normal.  Neck: Normal range of motion.  Cardiovascular: Normal rate, regular rhythm and normal heart sounds.   Pulmonary/Chest: Breath sounds normal.  Abdominal: Bowel sounds are normal.  Musculoskeletal: Normal range of motion. He exhibits no edema or tenderness.  Neurological: He is alert and oriented to person, place, and time.  Psychiatric: He has a normal mood and affect. His behavior is normal.          Assessment & Plan:   Status post pacemaker for high degree AV block History of paroxysmal atrial fibrillation  Dyslipidemia.  Check lipid profileHypothyroidism.  We'll review a TSH Dementia.  Continue Aricept  Follow-up 6 months Flu vaccine administered  Nyoka Cowden

## 2016-10-02 NOTE — Patient Instructions (Signed)
Limit your sodium (Salt) intake    It is important that you exercise regularly, at least 20 minutes 3 to 4 times per week.  If you develop chest pain or shortness of breath seek  medical attention.  Return in 6 months for follow-up  

## 2016-10-19 ENCOUNTER — Other Ambulatory Visit: Payer: Self-pay | Admitting: Internal Medicine

## 2016-10-25 ENCOUNTER — Ambulatory Visit (INDEPENDENT_AMBULATORY_CARE_PROVIDER_SITE_OTHER): Payer: Medicare Other | Admitting: Internal Medicine

## 2016-10-25 VITALS — BP 128/72 | HR 56 | Wt 149.0 lb

## 2016-10-25 DIAGNOSIS — R55 Syncope and collapse: Secondary | ICD-10-CM

## 2016-10-25 DIAGNOSIS — I453 Trifascicular block: Secondary | ICD-10-CM | POA: Diagnosis not present

## 2016-10-25 DIAGNOSIS — I495 Sick sinus syndrome: Secondary | ICD-10-CM | POA: Diagnosis not present

## 2016-10-25 DIAGNOSIS — Z950 Presence of cardiac pacemaker: Secondary | ICD-10-CM

## 2016-10-25 DIAGNOSIS — I440 Atrioventricular block, first degree: Secondary | ICD-10-CM

## 2016-10-25 LAB — CUP PACEART INCLINIC DEVICE CHECK
Date Time Interrogation Session: 20181025040000
Implantable Lead Implant Date: 20090303
Implantable Lead Location: 753859
Implantable Lead Location: 753860
Implantable Lead Model: 5076
Implantable Pulse Generator Implant Date: 20090303
Lead Channel Impedance Value: 610 Ohm
Lead Channel Pacing Threshold Amplitude: 0.9 V
Lead Channel Pacing Threshold Pulse Width: 0.4 ms
Lead Channel Sensing Intrinsic Amplitude: 12 mV
Lead Channel Sensing Intrinsic Amplitude: 3 mV
Lead Channel Setting Pacing Amplitude: 2 V
Lead Channel Setting Pacing Pulse Width: 0.4 ms
Lead Channel Setting Sensing Sensitivity: 2.5 mV
MDC IDC LEAD IMPLANT DT: 20090303
MDC IDC MSMT LEADCHNL RA IMPEDANCE VALUE: 540 Ohm
MDC IDC MSMT LEADCHNL RA PACING THRESHOLD AMPLITUDE: 0.6 V
MDC IDC MSMT LEADCHNL RA PACING THRESHOLD PULSEWIDTH: 0.4 ms
MDC IDC SET LEADCHNL RV PACING AMPLITUDE: 2.5 V
MDC IDC STAT BRADY RA PERCENT PACED: 0 %
MDC IDC STAT BRADY RV PERCENT PACED: 0 %
Pulse Gen Model: 1297
Pulse Gen Serial Number: 316920

## 2016-10-25 NOTE — Patient Instructions (Signed)
Your physician recommends that you continue on your current medications as directed. Please refer to the Current Medication list given to you today.    Your physician recommends that you schedule a follow-up appointment in:  AS NEEDED 

## 2016-10-25 NOTE — Progress Notes (Signed)
Patient Care Team: Marletta Lor, MD as PCP - General Stanford Breed Denice Bors, MD (Cardiology)   HPI  Jerome Garcia is a 83 y.o. male seen as an addon for a [employer] pacemaker implanted for syncope and trifascicular block.  His device has reached ERI   He has no specific complaints of dizziness. He denies chest pain peripheral edema.    Denies chest pain or shortness of breath     Past Medical History:  Diagnosis Date  . Benign prostatic hypertrophy   . Cancer [facility])    Bladder Cancer  . Carotid artery stenosis    left carotid bruit  . Gastric outlet obstruction 06/2007   history  . HLD (hyperlipidemia)   . Hx of bladder cancer   . Hypothyroidism   . Pacemaker   . Trifascicular block    history    Past Surgical History:  Procedure Laterality Date  . ESOPHAGOGASTRODUODENOSCOPY  07/2008  . GASTROJEJUNOSTOMY  06/2007  . HEMORROIDECTOMY    . ORCHIECTOMY    . PACEMAKER INSERTION  03/2007   permanent transvenous, [employer]    Current Outpatient Prescriptions  Medication Sig Dispense Refill  . aspirin 81 MG tablet Take 81 mg by mouth daily.      . Calcium Citrate-Vitamin D (CITRACAL + D PO) Take 2 tablets by mouth daily.     Marland Kitchen donepezil (ARICEPT) 5 MG tablet Take 1 tablet (5 mg total) by mouth at bedtime. 90 tablet 0  . donepezil (ARICEPT) 5 MG tablet TAKE 1 TABLET (5 MG TOTAL) BY MOUTH AT BEDTIME. 90 tablet 0  . levothyroxine (SYNTHROID, LEVOTHROID) 175 MCG tablet Take 1 tablet (175 mcg total) by mouth daily before breakfast. 90 tablet 1  . Multiple Vitamin (MULTIVITAMIN) tablet Take 1 tablet by mouth daily.      . Multiple Vitamins-Minerals (ICAPS PO) Take 2 capsules by mouth daily.     . simvastatin (ZOCOR) 20 MG tablet Take 1 tablet (20 mg total) by mouth at bedtime. 90 tablet 1  . tamsulosin (FLOMAX) 0.4 MG CAPS capsule TAKE 1 CAPSULE (0.4 MG TOTAL) BY MOUTH DAILY. 90 capsule 2  . vitamin E 400 UNIT capsule Take 400 Units by mouth daily.       No  current facility-administered medications for this visit.     No Known Allergies  Review of Systems negative except from HPI and PMH  Physical Exam BP 128/72 (BP Location: Left Arm, Patient Position: Sitting, Cuff Size: Normal)   Pulse (!) 56   Wt 149 lb (67.6 kg)   BMI 20.21 kg/m  Well developed and nourished in no acute distress HENT normal Neck supple with JVP-flat Carotids brisk and full without bruits Clear Device pocket well healed; without hematoma or erythema.  There is no tethering  Regular rate and rhythm, no murmurs or gallops Abd-soft with active BS without hepatomegaly No Clubbing cyanosis edema Skin-warm and dry A & Oriented  Grossly normal sensory and motor function      Assessment and  Plan  Syncope  Trifascicular block  Dementia  Peripheral vascular disease with AAA repair and internal carotid stenosis  Pacemaker [employer] at [employer]  The pts pacer fires 0.03% of the time  No recurrent syncope The issue is as to whether to change out generator with risk of infection--given infrequent pacing not sure that it is warranted-he will discuss with his son.   We spent more than 50% of our >25 min visit in face to face counseling  regarding the above

## 2016-10-29 ENCOUNTER — Encounter: Payer: Self-pay | Admitting: Internal Medicine

## 2016-10-29 DIAGNOSIS — I495 Sick sinus syndrome: Secondary | ICD-10-CM

## 2016-10-31 ENCOUNTER — Telehealth: Payer: Self-pay | Admitting: *Deleted

## 2016-10-31 NOTE — Telephone Encounter (Signed)
Spoke with patient's wife, Jerome Garcia ([place]).  Advised that as Dr. Caryl Comes planned to see the patient PRN, we should cancel his 01/29/17 OV appointment.  Jerome Garcia is agreeable and denies additional questions or concerns at this time.

## 2016-12-25 ENCOUNTER — Other Ambulatory Visit: Payer: Self-pay | Admitting: Internal Medicine

## 2017-01-29 ENCOUNTER — Encounter: Payer: Medicare Other | Admitting: Internal Medicine

## 2017-01-29 ENCOUNTER — Telehealth: Payer: Self-pay | Admitting: Internal Medicine

## 2017-01-29 NOTE — Telephone Encounter (Signed)
Copied from [place] [id number]. Topic: Quick Communication - See Telephone Encounter >> Jan 29, 2017 11:12 AM Boyd Kerbs wrote: CRM for notification. See Telephone encounter for:   Domingo Dimes, asking for in home care for  PT , Pt. is having challenge with getting up and starting to walk, getting his balance.   What Ins. Medicare will pay, if needs to PA   Please call Lanny Hurst if needed.  01/29/17.

## 2017-02-04 NOTE — Telephone Encounter (Signed)
Sir please advise.  

## 2017-02-05 NOTE — Telephone Encounter (Signed)
[place] for pt consult

## 2017-02-06 NOTE — Telephone Encounter (Signed)
Left detailed message on machine for [place] with verbal orders for PT.

## 2017-02-21 ENCOUNTER — Encounter: Payer: Self-pay | Admitting: Internal Medicine

## 2017-03-01 ENCOUNTER — Emergency Department (HOSPITAL_COMMUNITY)
Admission: EM | Admit: 2017-03-01 | Discharge: 2017-03-01 | Disposition: A | Discharge status: Home / Self Care | Payer: Medicare Other | Attending: Emergency Medicine | Admitting: Emergency Medicine

## 2017-03-01 ENCOUNTER — Emergency Department (HOSPITAL_COMMUNITY): Payer: Medicare Other

## 2017-03-01 ENCOUNTER — Other Ambulatory Visit: Payer: Self-pay

## 2017-03-01 DIAGNOSIS — Z87891 Personal history of nicotine dependence: Secondary | ICD-10-CM | POA: Diagnosis not present

## 2017-03-01 DIAGNOSIS — E039 Hypothyroidism, unspecified: Secondary | ICD-10-CM | POA: Insufficient documentation

## 2017-03-01 DIAGNOSIS — Z79899 Other long term (current) drug therapy: Secondary | ICD-10-CM | POA: Insufficient documentation

## 2017-03-01 DIAGNOSIS — Z950 Presence of cardiac pacemaker: Secondary | ICD-10-CM | POA: Diagnosis not present

## 2017-03-01 DIAGNOSIS — R531 Weakness: Secondary | ICD-10-CM | POA: Diagnosis present

## 2017-03-01 DIAGNOSIS — Z8551 Personal history of malignant neoplasm of bladder: Secondary | ICD-10-CM | POA: Insufficient documentation

## 2017-03-01 DIAGNOSIS — Z7982 Long term (current) use of aspirin: Secondary | ICD-10-CM | POA: Diagnosis not present

## 2017-03-01 DIAGNOSIS — R61 Generalized hyperhidrosis: Secondary | ICD-10-CM | POA: Insufficient documentation

## 2017-03-01 DIAGNOSIS — I2510 Atherosclerotic heart disease of native coronary artery without angina pectoris: Secondary | ICD-10-CM | POA: Diagnosis not present

## 2017-03-01 LAB — URINALYSIS, ROUTINE W REFLEX MICROSCOPIC
Bilirubin Urine: NEGATIVE
Glucose, UA: NEGATIVE mg/dL
HGB URINE DIPSTICK: NEGATIVE
Ketones, ur: NEGATIVE mg/dL
Leukocytes, UA: NEGATIVE
Nitrite: NEGATIVE
PH: 6 (ref 5.0–8.0)
Protein, ur: NEGATIVE mg/dL
SPECIFIC GRAVITY, URINE: 1.011 (ref 1.005–1.030)

## 2017-03-01 LAB — CBC WITH DIFFERENTIAL/PLATELET
BASOS ABS: 0 10*3/uL (ref 0.0–0.1)
Basophils Relative: 1 %
Eosinophils Absolute: 0.1 10*3/uL (ref 0.0–0.7)
Eosinophils Relative: 2 %
HEMATOCRIT: 46.9 % (ref 39.0–52.0)
Hemoglobin: 15.3 g/dL (ref 13.0–17.0)
LYMPHS ABS: 1.3 10*3/uL (ref 0.7–4.0)
LYMPHS PCT: 21 %
MCH: 31 pg (ref 26.0–34.0)
MCHC: 32.6 g/dL (ref 30.0–36.0)
MCV: 94.9 fL (ref 78.0–100.0)
MONO ABS: 0.5 10*3/uL (ref 0.1–1.0)
MONOS PCT: 8 %
NEUTROS ABS: 4.1 10*3/uL (ref 1.7–7.7)
Neutrophils Relative %: 68 %
Platelets: 184 10*3/uL (ref 150–400)
RBC: 4.94 MIL/uL (ref 4.22–5.81)
RDW: 14.9 % (ref 11.5–15.5)
WBC: 6 10*3/uL (ref 4.0–10.5)

## 2017-03-01 LAB — COMPREHENSIVE METABOLIC PANEL
ALBUMIN: 3.7 g/dL (ref 3.5–5.0)
ALT: 14 U/L — ABNORMAL LOW (ref 17–63)
ANION GAP: 9 (ref 5–15)
AST: 26 U/L (ref 15–41)
Alkaline Phosphatase: 60 U/L (ref 38–126)
BUN: 26 mg/dL — AB (ref 6–20)
CO2: 27 mmol/L (ref 22–32)
Calcium: 9.2 mg/dL (ref 8.9–10.3)
Chloride: 104 mmol/L (ref 101–111)
Creatinine, Ser: 1.47 mg/dL — ABNORMAL HIGH (ref 0.61–1.24)
GFR calc Af Amer: 46 mL/min — ABNORMAL LOW (ref >60)
GFR calc non Af Amer: 40 mL/min — ABNORMAL LOW (ref >60)
GLUCOSE: 103 mg/dL — AB (ref 65–99)
POTASSIUM: 5 mmol/L (ref 3.5–5.1)
SODIUM: 140 mmol/L (ref 135–145)
TOTAL PROTEIN: 6.9 g/dL (ref 6.5–8.1)
Total Bilirubin: 0.7 mg/dL (ref 0.3–1.2)

## 2017-03-01 LAB — I-STAT TROPONIN, ED: Troponin i, poc: 0 ng/mL (ref 0.00–0.08)

## 2017-03-01 LAB — I-STAT CG4 LACTIC ACID, ED: LACTIC ACID, VENOUS: 1.56 mmol/L (ref 0.5–1.9)

## 2017-03-01 NOTE — ED Notes (Signed)
Attempted to interrogate pacemaker with all three options available. No success at this time.

## 2017-03-01 NOTE — ED Notes (Signed)
Patients family states patient is agitated and wants to leave. MD notified and is in room talking with patient.

## 2017-03-01 NOTE — ED Notes (Signed)
Patient demanded to stand an urinate. Patient unsteady and unable to stand alone. Patient urinated on floor and was unable to use fine motor control to hold urinal steady. Patient cleaned, MD notified.

## 2017-03-01 NOTE — ED Notes (Signed)
Pt given sandwich bag with apple juice.

## 2017-03-01 NOTE — ED Triage Notes (Signed)
Patient woke up this AM with generalized weakness and no other symptoms. Patient currently has a pacemaker, current HR is 52 with no pacemaker function noticed on EKG.

## 2017-03-01 NOTE — Discharge Instructions (Signed)
Follow up with your cardiologist non emergent.  Return if worsening of condition or as needed.

## 2017-03-01 NOTE — ED Provider Notes (Signed)
[facility] DEPARTMENT Provider Note   CSN: [id number] Arrival date & time: 03/01/17  1319     History   Chief Complaint Chief Complaint  Patient presents with  . Weakness    HPI Jerome Garcia is a 84 y.o. male.  HPI  84 year old male history of syncope/trifascicular block with a permanent pacemaker, thyroid disease, hyperlipidemia, history of bladder cancer, vascular disease with AAA repair has a Psychologist, clinical pacemaker implanted March 2009 patient was evaluated by cardiology July 2018 found to have less than 6 months battery life.  Family members arrived with the patient states that the caregiver had noticed the patient having what appeared to be generalized weakness with associated diaphoresis that was transient and resolved.  Negative discrete syncope, chest pain or shortness of breath.   Past Medical History:  Diagnosis Date  . Benign prostatic hypertrophy   . Cancer [facility])    Bladder Cancer  . Carotid artery stenosis    left carotid bruit  . Gastric outlet obstruction 06/2007   history  . HLD (hyperlipidemia)   . Hx of bladder cancer   . Hypothyroidism   . Pacemaker   . Trifascicular block    history    Patient Active Problem List   Diagnosis Date Noted  . Memory impairment 12/01/2014  . Chronotropic incompetence with sinus node dysfunction (HCC) 10/17/2011  . Abdominal pulsatile mass 10/17/2011  . 1st degree AV block 10/17/2011  . [employer] 10/12/2010  . Atrial fibrillation ([place]) 10/12/2010  . DIZZINESS 03/11/2008  . CAROTID ARTERY DISEASE 02/27/2008  . MALNUTRITION 08/05/2007  . SUPERFICIAL VEIN THROMBOSIS 07/29/2006  . BLADDER CANCER 07/01/2006  . Hypothyroidism 07/01/2006  . Dyslipidemia 07/01/2006  . BLOCK, TRIFASCICULAR 07/01/2006  . BENIGN PROSTATIC HYPERTROPHY 07/01/2006    Past Surgical History:  Procedure Laterality Date  . ESOPHAGOGASTRODUODENOSCOPY  07/2008  .  GASTROJEJUNOSTOMY  06/2007  . HEMORROIDECTOMY    . ORCHIECTOMY    . PACEMAKER INSERTION  03/2007   permanent transvenous, [employer]       Home Medications    Prior to Admission medications   Medication Sig Start Date End Date Taking? Authorizing Provider  aspirin 81 MG tablet Take 81 mg by mouth daily.     Yes [provider]  Calcium Citrate-Vitamin D (CITRACAL + D PO) Take 2 tablets by mouth daily.    Yes [provider]  donepezil (ARICEPT) 5 MG tablet TAKE 1 TABLET (5 MG TOTAL) BY MOUTH AT BEDTIME. 10/19/16  Yes Marletta Lor, MD  levothyroxine (SYNTHROID, LEVOTHROID) 175 MCG tablet Take 1 tablet (175 mcg total) by mouth daily before breakfast. 10/02/16  Yes Marletta Lor, MD  Multiple Vitamin (MULTIVITAMIN) tablet Take 1 tablet by mouth daily.     Yes [provider]  Multiple Vitamins-Minerals (ICAPS PO) Take 2 capsules by mouth daily.    Yes [provider]  simvastatin (ZOCOR) 20 MG tablet Take 1 tablet (20 mg total) by mouth at bedtime. 10/02/16  Yes Marletta Lor, MD  tamsulosin (FLOMAX) 0.4 MG CAPS capsule TAKE 1 CAPSULE (0.4 MG TOTAL) BY MOUTH DAILY. 10/02/16  Yes Marletta Lor, MD  vitamin E 400 UNIT capsule Take 400 Units by mouth daily.     Yes [provider]  donepezil (ARICEPT) 5 MG tablet TAKE 1 TABLET AT BEDTIME Patient not taking: Reported on 03/01/2017 12/26/16   Marletta Lor, MD    Family History Family History  Problem Relation  Age of Onset  . Throat cancer Father        in his 70's  . Cancer Father   . Lung cancer Mother   . Cancer Mother   . Heart attack Sister   . Heart disease Sister        After 8 yrs of age  . Cancer Brother   . Other Brother        history of oral cancer  . Dementia Sister     Social History Social History   Tobacco Use  . Smoking status: Former Smoker    Years: 30.00    Types: Cigarettes    Last attempt to quit: 01/01/1978    Years since  quitting: 39.1  . Smokeless tobacco: Never Used  Substance Use Topics  . Alcohol use: No  . Drug use: No     Allergies   Patient has no known allergies.   Review of Systems Review of Systems  Review of Systems  Constitutional: Negative for fever and chills.  HENT: Negative for ear pain, sore throat and trouble swallowing.   Eyes: Negative for pain and visual disturbance.  Respiratory: Negative for cough and shortness of breath.   Cardiovascular: Negative for chest pain and leg swelling.  Gastrointestinal: Negative for nausea, vomiting, abdominal pain and diarrhea.  Genitourinary: Negative for dysuria, urgency and frequency.  Musculoskeletal: Negative for back pain and joint swelling.  Skin: Negative for rash and wound.  Neurological: Negative for dizziness, syncope, speech difficulty; resolved weakness  Physical Exam Updated Vital Signs BP (!) 146/99   Pulse (!) 44   Resp 18   SpO2 91%   Physical Exam  Physical Exam Vitals:   03/01/17 1830 03/01/17 1845  BP: (!) 154/108 (!) 146/99  Pulse: (!) 54 (!) 44  Resp: 16 18  SpO2: 94% 91%   Constitutional: Patient is in no acute distress Head: Normocephalic and atraumatic.  Eyes: Extraocular motion intact, no scleral icterus Neck: Supple without meningismus, mass, or overt JVD Respiratory: Effort normal and breath sounds normal. No respiratory distress. CV: bradycardia,  no obvious murmurs.  Pulses +2 and symmetric Abdomen: Soft, non-tender, non-distended MSK: Extremities are atraumatic without deformity, ROM intact Skin: Warm, dry, intact Neuro: Alert and oriented, no motor deficit noted Psychiatric: Mood and affect are normal.  ED Treatments / Results  Labs (all labs ordered are listed, but only abnormal results are displayed) Labs Reviewed  COMPREHENSIVE METABOLIC PANEL - Abnormal; Notable for the following components:      Result Value   Glucose, Bld 103 (*)    BUN 26 (*)    Creatinine, Ser 1.47 (*)    ALT  14 (*)    GFR calc non Af Amer 40 (*)    GFR calc Af Amer 46 (*)    All other components within normal limits  CBC WITH DIFFERENTIAL/PLATELET  URINALYSIS, ROUTINE W REFLEX MICROSCOPIC  I-STAT TROPONIN, ED  I-STAT CG4 LACTIC ACID, ED    EKG  EKG Interpretation  Date/Time:  Friday March 01 2017 13:27:35 EST Ventricular Rate:  49 PR Interval:  318 QRS Duration: 144 QT Interval:  494 QTC Calculation: 446 R Axis:   -90 Text Interpretation:  Sinus bradycardia with 1st degree A-V block with Premature atrial complexes Left axis deviation Right bundle branch block Abnormal ECG not paced Otherwise no significant change Confirmed by Deno Etienne [phone]) on 03/01/2017 4:24:54 PM       Radiology Dg Chest Portable 1 View  Result Date: 03/01/2017  CLINICAL DATA:  Shortness of breath. EXAM: PORTABLE CHEST 1 VIEW COMPARISON:  07/26/2007. FINDINGS: Cardiac pacer with lead tips over the right atrium right ventricle. Cardiomegaly with normal pulmonary vascularity. No focal infiltrate. No pleural effusion or pneumothorax. IMPRESSION: 1. Cardiac pacer with lead tips in right atrium right ventricle. Cardiomegaly. No pulmonary venous congestion. 2.  Low lung volumes. Electronically Signed   By: Marcello Moores  Register   On: 03/01/2017 14:24    Procedures Procedures (including critical care time)  Medications Ordered in ED Medications - No data to display   Initial Impression / Assessment and Plan / ED Course  I have reviewed the triage vital signs and the nursing notes.  Pertinent labs & imaging results that were available during my care of the patient were reviewed by me and considered in my medical decision making (see chart for details).     84 year old male history of syncope/trifascicular block with a permanent pacemaker, thyroid disease, hyperlipidemia, history of bladder cancer, vascular disease with AAA repair has a Psychologist, clinical pacemaker implanted March 2009 patient was  evaluated by cardiology July 2018 found to have less than 6 months battery life.  Family members arrived with the patient states that the caregiver had noticed the patient having what appeared to be generalized weakness with associated diaphoresis that was transient and resolved.  Negative discrete syncope, chest pain or shortness of breath.  Review of patient's lab shows no evidence of leukocytosis, stable H&H, negative electrolyte imbalance, urine profile not consistent with infection, troponin x1 undetectable, lactic acid 1.56 with no findings concerning on chest x-ray.  Pacemaker unable to be interrogated but has power per Dr. Caryl Comes cardiologist.  Patient has not had an arrhythmia requiring pacing for Dr. Caryl Comes cardiologist.  Patient had a prolonged observation in the emergency department with no perceivable subjective/objective findings concerning for cardiac etiology.  Patient is baseline functional/cognitive status per family members have been at the bedside.  Plan for discharge home in the care of family members with follow-up to cardiology within the next 72 hours and has good return precautions.       Final Clinical Impressions(s) / ED Diagnoses   Final diagnoses:  Weakness    ED Discharge Orders    None       Willette Alma, DO 03/01/17 2112    Deno Etienne, DO 03/01/17 2337

## 2017-03-01 NOTE — ED Notes (Signed)
GCS 14.Skin warm and dry. Respirations equal and  Unlabored. HR 70 upon discharge. Pt stable. VSS.

## 2017-03-26 ENCOUNTER — Other Ambulatory Visit: Payer: Self-pay | Admitting: Internal Medicine

## 2017-04-02 ENCOUNTER — Ambulatory Visit: Payer: Medicare Other | Admitting: Internal Medicine

## 2017-04-02 ENCOUNTER — Encounter: Payer: Self-pay | Admitting: Internal Medicine

## 2017-04-02 VITALS — BP 102/68 | HR 51 | Wt 152.0 lb

## 2017-04-02 DIAGNOSIS — E039 Hypothyroidism, unspecified: Secondary | ICD-10-CM

## 2017-04-02 DIAGNOSIS — R413 Other amnesia: Secondary | ICD-10-CM

## 2017-04-02 DIAGNOSIS — Z950 Presence of cardiac pacemaker: Secondary | ICD-10-CM

## 2017-04-02 NOTE — Progress Notes (Signed)
Subjective:    Patient ID: Jerome Garcia, male    DOB: 07-Jul-1923, 84 y.o.   MRN: [id number]  HPI 84 year old patient who is seen today for 20-month follow-up.  He is followed by cardiology with a history of complete heart block status post pacemaker.  He has senile dementia and a history of hypothyroidism.  Remains on statin therapy for dyslipidemia.  No concerns or complaints.  Accompanied by a daughter today who is pleased with his progress.  He was seen in the ED last month with some weakness and laboratory studies were reviewed and were unremarkable  Past Medical History:  Diagnosis Date  . Benign prostatic hypertrophy   . Cancer [facility])    Bladder Cancer  . Carotid artery stenosis    left carotid bruit  . Gastric outlet obstruction 06/2007   history  . HLD (hyperlipidemia)   . Hx of bladder cancer   . Hypothyroidism   . Pacemaker   . Trifascicular block    history     Social History   Socioeconomic History  . Marital status: Married    Spouse name: Not on file  . Number of children: Not on file  . Years of education: Not on file  . Highest education level: Not on file  Occupational History  . Not on file  Social Needs  . Financial resource strain: Not on file  . Food insecurity:    Worry: Not on file    Inability: Not on file  . Transportation needs:    Medical: Not on file    Non-medical: Not on file  Tobacco Use  . Smoking status: Former Smoker    Years: 30.00    Types: Cigarettes    Last attempt to quit: 01/01/1978    Years since quitting: 39.2  . Smokeless tobacco: Never Used  Substance and Sexual Activity  . Alcohol use: No  . Drug use: No  . Sexual activity: Not on file  Lifestyle  . Physical activity:    Days per week: Not on file    Minutes per session: Not on file  . Stress: Not on file  Relationships  . Social connections:    Talks on phone: Not on file    Gets together: Not on file    Attends religious service: Not on file    Active member  of club or organization: Not on file    Attends meetings of clubs or organizations: Not on file    Relationship status: Not on file  . Intimate partner violence:    Fear of current or ex partner: Not on file    Emotionally abused: Not on file    Physically abused: Not on file    Forced sexual activity: Not on file  Other Topics Concern  . Not on file  Social History Narrative  . Not on file    Past Surgical History:  Procedure Laterality Date  . ESOPHAGOGASTRODUODENOSCOPY  07/2008  . GASTROJEJUNOSTOMY  06/2007  . HEMORROIDECTOMY    . ORCHIECTOMY    . PACEMAKER INSERTION  03/2007   permanent transvenous, [employer]    Family History  Problem Relation Age of Onset  . Throat cancer Father        in his 77's  . Cancer Father   . Lung cancer Mother   . Cancer Mother   . Heart attack Sister   . Heart disease Sister        After 51 yrs of age  .  Cancer Brother   . Other Brother        history of oral cancer  . Dementia Sister     No Known Allergies  Current Outpatient Medications on File Prior to Visit  Medication Sig Dispense Refill  . aspirin 81 MG tablet Take 81 mg by mouth daily.      . Calcium Citrate-Vitamin D (CITRACAL + D PO) Take 2 tablets by mouth daily.     Marland Kitchen donepezil (ARICEPT) 5 MG tablet TAKE 1 TABLET (5 MG TOTAL) BY MOUTH AT BEDTIME. 90 tablet 0  . donepezil (ARICEPT) 5 MG tablet TAKE 1 TABLET AT BEDTIME 90 tablet 0  . levothyroxine (SYNTHROID, LEVOTHROID) 175 MCG tablet Take 1 tablet (175 mcg total) by mouth daily before breakfast. 90 tablet 1  . Multiple Vitamin (MULTIVITAMIN) tablet Take 1 tablet by mouth daily.      . Multiple Vitamins-Minerals (ICAPS PO) Take 2 capsules by mouth daily.     . simvastatin (ZOCOR) 20 MG tablet Take 1 tablet (20 mg total) by mouth at bedtime. 90 tablet 1  . tamsulosin (FLOMAX) 0.4 MG CAPS capsule TAKE 1 CAPSULE (0.4 MG TOTAL) BY MOUTH DAILY. 90 capsule 2  . vitamin E 400 UNIT capsule Take 400 Units by mouth daily.        No current facility-administered medications on file prior to visit.     BP 102/68 (BP Location: Left Arm, Patient Position: Sitting, Cuff Size: Normal)   Pulse (!) 51   Wt 152 lb (68.9 kg)   SpO2 99%   BMI 20.61 kg/m      Review of Systems  Constitutional: Positive for fatigue. Negative for appetite change, chills and fever.  HENT: Negative for congestion, dental problem, ear pain, hearing loss, sore throat, tinnitus, trouble swallowing and voice change.   Eyes: Negative for pain, discharge and visual disturbance.  Respiratory: Negative for cough, chest tightness, wheezing and stridor.   Cardiovascular: Negative for chest pain, palpitations and leg swelling.  Gastrointestinal: Negative for abdominal distention, abdominal pain, blood in stool, constipation, diarrhea, nausea and vomiting.  Genitourinary: Negative for difficulty urinating, discharge, flank pain, genital sores, hematuria and urgency.  Musculoskeletal: Negative for arthralgias, back pain, gait problem, joint swelling, myalgias and neck stiffness.  Skin: Negative for rash.  Neurological: Negative for dizziness, syncope, speech difficulty, weakness, numbness and headaches.  Hematological: Negative for adenopathy. Does not bruise/bleed easily.  Psychiatric/Behavioral: Positive for confusion, decreased concentration and sleep disturbance. Negative for behavioral problems and dysphoric mood. The patient is not nervous/anxious.        Objective:   Physical Exam  Constitutional: He is oriented to person, place, and time. He appears well-developed.  HENT:  Head: Normocephalic.  Right Ear: External ear normal.  Left Ear: External ear normal.  Hearing aids in place  Eyes: Conjunctivae and EOM are normal.  Neck: Normal range of motion.  Cardiovascular: Normal rate, regular rhythm and normal heart sounds.  Posterior tibial pulses full  Pulmonary/Chest: Breath sounds normal.  Abdominal: Bowel sounds are normal.    Musculoskeletal: Normal range of motion. He exhibits no edema or tenderness.  Neurological: He is alert and oriented to person, place, and time.  Psychiatric: He has a normal mood and affect. His behavior is normal.          Assessment & Plan:   Status post pacemaker for complete heart block  hypothyroidism Status post pacemaker Senile dementia continue Aricept  No change in medical regimen Follow-up 6 months  Chaela Branscum  Pilar Plate

## 2017-04-02 NOTE — Patient Instructions (Signed)
Limit your sodium (Salt) intake  Return in 6 months for follow-up    Cardiology follow-up as scheduled  No change in medical regimen

## 2017-04-05 ENCOUNTER — Telehealth: Payer: Self-pay

## 2017-04-05 NOTE — Telephone Encounter (Signed)
Spoke with [employer] with a report of patient at [facility]. This is a second report with the first notification at 10/29/2016. Per notes patient will follow with [facility]. [employer] will discontinue remote monitoring.

## 2017-05-10 ENCOUNTER — Other Ambulatory Visit: Payer: Self-pay | Admitting: Internal Medicine

## 2017-05-24 ENCOUNTER — Other Ambulatory Visit: Payer: Self-pay | Admitting: Internal Medicine

## 2017-06-24 ENCOUNTER — Other Ambulatory Visit: Payer: Self-pay | Admitting: Internal Medicine

## 2017-06-26 NOTE — Telephone Encounter (Signed)
Sent to the pharmacy by e-scribe for 90 days.  Pt has transfer of care appt with [facility] on 07/23/17.

## 2017-07-09 ENCOUNTER — Other Ambulatory Visit: Payer: Self-pay | Admitting: Internal Medicine

## 2017-07-23 ENCOUNTER — Encounter: Payer: Self-pay | Admitting: Adult Health

## 2017-07-23 ENCOUNTER — Ambulatory Visit: Payer: Medicare Other | Admitting: Adult Health

## 2017-07-23 VITALS — BP 118/74 | Temp 97.8°F | Wt 148.0 lb

## 2017-07-23 DIAGNOSIS — I440 Atrioventricular block, first degree: Secondary | ICD-10-CM

## 2017-07-23 DIAGNOSIS — Z7689 Persons encountering health services in other specified circumstances: Secondary | ICD-10-CM | POA: Diagnosis not present

## 2017-07-23 DIAGNOSIS — E039 Hypothyroidism, unspecified: Secondary | ICD-10-CM

## 2017-07-23 DIAGNOSIS — R413 Other amnesia: Secondary | ICD-10-CM | POA: Diagnosis not present

## 2017-07-23 DIAGNOSIS — E785 Hyperlipidemia, unspecified: Secondary | ICD-10-CM | POA: Diagnosis not present

## 2017-07-23 NOTE — Progress Notes (Signed)
Patient presents to clinic today to establish care. He is a pleasant 84 year old male who  has a past medical history of Benign prostatic hypertrophy, Cancer ([place]), Carotid artery stenosis, Gastric outlet obstruction (06/2007), HLD (hyperlipidemia), bladder cancer, Hypothyroidism, Pacemaker, and Trifascicular block. He is with family and caregiver today   He is a former patient of Dr. Raliegh Ip who is switching to me as his previous PCP is retiring from [employer]. He was seeing Dr. Raliegh Ip twice a year.   Acute Concerns: Establish Care  Chronic Issues: Complete Heart Block s/p pace maker - followed by cardiology  Dyslipidemia - Takes Zocor 20 mg  Lab Results  Component Value Date   CHOL 125 09/26/2015   HDL 57.40 09/26/2015   LDLCALC 49 09/26/2015   TRIG 94.0 09/26/2015   CHOLHDL 2 09/26/2015    Hypothyroidism - 175 mcg Lab Results  Component Value Date   TSH 2.89 09/26/2015   BPH - Controlled with Flomax 0.4 mg   Senile Dementia - Aricept 5 mg - family reports episodes of increased confusion at times.   Health Maintenance: Dental -- Routine Care  Vision -- Routine Care  Immunizations - UTD  Colonoscopy - No longer needs   Past Medical History:  Diagnosis Date  . Benign prostatic hypertrophy   . Cancer [facility])    Bladder Cancer  . Carotid artery stenosis    left carotid bruit  . Gastric outlet obstruction 06/2007   history  . HLD (hyperlipidemia)   . Hx of bladder cancer   . Hypothyroidism   . Pacemaker   . Trifascicular block    history    Past Surgical History:  Procedure Laterality Date  . ESOPHAGOGASTRODUODENOSCOPY  07/2008  . GASTROJEJUNOSTOMY  06/2007  . HEMORROIDECTOMY    . ORCHIECTOMY    . PACEMAKER INSERTION  03/2007   permanent transvenous, [employer]    Current Outpatient Medications on File Prior to Visit  Medication Sig Dispense Refill  . aspirin 81 MG tablet Take 81 mg by mouth daily.      . Calcium Citrate-Vitamin D (CITRACAL + D PO) Take 2  tablets by mouth daily.     Marland Kitchen donepezil (ARICEPT) 5 MG tablet TAKE 1 TABLET AT BEDTIME 90 tablet 0  . levothyroxine (SYNTHROID, LEVOTHROID) 175 MCG tablet TAKE 1 TABLET DAILY BEFORE BREAKFAST 90 tablet 1  . Multiple Vitamins-Minerals (ICAPS PO) Take 2 capsules by mouth daily.     . simvastatin (ZOCOR) 20 MG tablet TAKE 1 TABLET AT BEDTIME 90 tablet 1  . tamsulosin (FLOMAX) 0.4 MG CAPS capsule TAKE 1 CAPSULE DAILY 90 capsule 2  . vitamin E 400 UNIT capsule Take 400 Units by mouth daily.       No current facility-administered medications on file prior to visit.     No Known Allergies  Family History  Problem Relation Age of Onset  . Throat cancer Father        in his 10's  . Cancer Father   . Lung cancer Mother   . Cancer Mother   . Heart attack Sister   . Heart disease Sister        After 67 yrs of age  . Cancer Brother   . Other Brother        history of oral cancer  . Dementia Sister     Social History   Socioeconomic History  . Marital status: Married    Spouse name: Not on file  . Number of  children: Not on file  . Years of education: Not on file  . Highest education level: Not on file  Occupational History  . Not on file  Social Needs  . Financial resource strain: Not on file  . Food insecurity:    Worry: Not on file    Inability: Not on file  . Transportation needs:    Medical: Not on file    Non-medical: Not on file  Tobacco Use  . Smoking status: Former Smoker    Years: 30.00    Types: Cigarettes    Last attempt to quit: 01/01/1978    Years since quitting: 39.5  . Smokeless tobacco: Never Used  Substance and Sexual Activity  . Alcohol use: No  . Drug use: No  . Sexual activity: Not on file  Lifestyle  . Physical activity:    Days per week: Not on file    Minutes per session: Not on file  . Stress: Not on file  Relationships  . Social connections:    Talks on phone: Not on file    Gets together: Not on file    Attends religious service: Not on  file    Active member of club or organization: Not on file    Attends meetings of clubs or organizations: Not on file    Relationship status: Not on file  . Intimate partner violence:    Fear of current or ex partner: Not on file    Emotionally abused: Not on file    Physically abused: Not on file    Forced sexual activity: Not on file  Other Topics Concern  . Not on file  Social History Narrative   Art gallery manager - retired     Review of Systems  Constitutional: Positive for malaise/fatigue.  HENT: Positive for hearing loss.   Respiratory: Negative.   Cardiovascular: Negative.   Genitourinary: Negative.   Musculoskeletal: Negative for falls.  Psychiatric/Behavioral: Positive for memory loss.       BP 118/74   Temp 97.8 F (36.6 C) (Oral)   Wt 148 lb (67.1 kg)   BMI 20.07 kg/m   Physical Exam  Constitutional: He appears well-developed and well-nourished. No distress.  Cardiovascular: Normal rate and regular rhythm.  Pulmonary/Chest: Effort normal and breath sounds normal.  Musculoskeletal:  Walks with 4 prong cane   Neurological: He is alert.  Skin: Skin is warm and dry. He is not diaphoretic.  Psychiatric: His behavior is normal. Judgment and thought content normal. Cognition and memory are impaired. He exhibits abnormal recent memory and abnormal remote memory.  Nursing note and vitals reviewed.  Assessment/Plan: 1. Encounter to establish care - Follow up in October for lab work or sooner if needed  2. Memory impairment - Discussed increasing Aricept at this visit. Will hold off for now - Consider at October visit   3. Acquired hypothyroidism - Continue with synthroid dose - Follow up in October for lab work   4. Dyslipidemia - Continue statin  - Follow up in October for lab work   5. 1st degree AV block - Continue with Cardiology plan of care  Dorothyann Peng, NP

## 2017-07-25 ENCOUNTER — Ambulatory Visit: Payer: Self-pay

## 2017-07-25 NOTE — Telephone Encounter (Signed)
Family called because the morning CNA took pt.'s BP and got 97/60. When the afternoon CNA re-checked his BP she got 112/67. Pt. Has no complaints or symptoms. They will continue to monitor BP.

## 2017-09-22 ENCOUNTER — Other Ambulatory Visit: Payer: Self-pay | Admitting: Internal Medicine

## 2017-10-22 ENCOUNTER — Ambulatory Visit (INDEPENDENT_AMBULATORY_CARE_PROVIDER_SITE_OTHER): Payer: Medicare Other | Admitting: Adult Health

## 2017-10-22 ENCOUNTER — Telehealth: Payer: Self-pay | Admitting: Adult Health

## 2017-10-22 ENCOUNTER — Encounter: Payer: Self-pay | Admitting: Adult Health

## 2017-10-22 VITALS — BP 120/78 | Temp 97.5°F | Wt 142.0 lb

## 2017-10-22 DIAGNOSIS — E039 Hypothyroidism, unspecified: Secondary | ICD-10-CM | POA: Diagnosis not present

## 2017-10-22 DIAGNOSIS — Z23 Encounter for immunization: Secondary | ICD-10-CM | POA: Diagnosis not present

## 2017-10-22 DIAGNOSIS — R413 Other amnesia: Secondary | ICD-10-CM

## 2017-10-22 DIAGNOSIS — E785 Hyperlipidemia, unspecified: Secondary | ICD-10-CM | POA: Diagnosis not present

## 2017-10-22 DIAGNOSIS — F419 Anxiety disorder, unspecified: Secondary | ICD-10-CM | POA: Diagnosis not present

## 2017-10-22 LAB — CBC WITH DIFFERENTIAL/PLATELET
Basophils Absolute: 0.1 10*3/uL (ref 0.0–0.1)
Basophils Relative: 0.6 % (ref 0.0–3.0)
EOS PCT: 2.5 % (ref 0.0–5.0)
Eosinophils Absolute: 0.2 10*3/uL (ref 0.0–0.7)
HCT: 43.8 % (ref 39.0–52.0)
Hemoglobin: 14.5 g/dL (ref 13.0–17.0)
LYMPHS ABS: 1.6 10*3/uL (ref 0.7–4.0)
Lymphocytes Relative: 18.8 % (ref 12.0–46.0)
MCHC: 33.1 g/dL (ref 30.0–36.0)
MCV: 93.5 fl (ref 78.0–100.0)
MONO ABS: 0.8 10*3/uL (ref 0.1–1.0)
Monocytes Relative: 9.2 % (ref 3.0–12.0)
NEUTROS ABS: 6 10*3/uL (ref 1.4–7.7)
Neutrophils Relative %: 68.9 % (ref 43.0–77.0)
Platelets: 184 10*3/uL (ref 150.0–400.0)
RBC: 4.68 Mil/uL (ref 4.22–5.81)
RDW: 14.3 % (ref 11.5–15.5)
WBC: 8.7 10*3/uL (ref 4.0–10.5)

## 2017-10-22 LAB — BASIC METABOLIC PANEL
BUN: 31 mg/dL — AB (ref 6–23)
CALCIUM: 8.8 mg/dL (ref 8.4–10.5)
CO2: 30 meq/L (ref 19–32)
Chloride: 105 mEq/L (ref 96–112)
Creatinine, Ser: 1.48 mg/dL (ref 0.40–1.50)
GFR: 47.17 mL/min — AB (ref >60.00)
GLUCOSE: 109 mg/dL — AB (ref 70–99)
Potassium: 4.6 mEq/L (ref 3.5–5.1)
Sodium: 141 mEq/L (ref 135–145)

## 2017-10-22 LAB — LIPID PANEL
Cholesterol: 139 mg/dL (ref 0–200)
HDL: 47.1 mg/dL (ref >39.00)
LDL Cholesterol: 52 mg/dL (ref 0–99)
NonHDL: 92.28
Total CHOL/HDL Ratio: 3
Triglycerides: 200 mg/dL — ABNORMAL HIGH (ref 0.0–149.0)
VLDL: 40 mg/dL (ref 0.0–40.0)

## 2017-10-22 LAB — HEPATIC FUNCTION PANEL
ALK PHOS: 62 U/L (ref 39–117)
ALT: 9 U/L (ref 0–53)
AST: 15 U/L (ref 0–37)
Albumin: 3.7 g/dL (ref 3.5–5.2)
BILIRUBIN DIRECT: 0.1 mg/dL (ref 0.0–0.3)
TOTAL PROTEIN: 6.4 g/dL (ref 6.0–8.3)
Total Bilirubin: 0.4 mg/dL (ref 0.2–1.2)

## 2017-10-22 LAB — TSH: TSH: 0.23 u[IU]/mL — AB (ref 0.35–4.50)

## 2017-10-22 MED ORDER — LEVOTHYROXINE SODIUM 175 MCG PO TABS: 175.0000 ug | ORAL_TABLET | Freq: Every day | ORAL | 3 refills | Status: AC

## 2017-10-22 MED ORDER — CITALOPRAM HYDROBROMIDE 10 MG PO TABS: 10.0000 mg | ORAL_TABLET | Freq: Every day | ORAL | 1 refills | Status: AC

## 2017-10-22 MED ORDER — SIMVASTATIN 20 MG PO TABS: 20.0000 mg | ORAL_TABLET | Freq: Every day | ORAL | 3 refills | Status: AC

## 2017-10-22 MED ORDER — DONEPEZIL HCL 10 MG PO TABS: 10.0000 mg | ORAL_TABLET | Freq: Every day | ORAL | 3 refills | Status: AC

## 2017-10-22 NOTE — Patient Instructions (Signed)
It was great seeing you today   I would like you to continue to take Citracel + D and Vitamin E   I have added a medication called Celexa to help with anxiety and depression. Some people experience diarrhea for the first two weeks of taking this, hopefully this does not happen to you   I will follow up with you about the labs   Lets see each other in 6 months to see how you are doing

## 2017-10-22 NOTE — Telephone Encounter (Signed)
That is correct.   You can also let her know that his labs are stable, except for an increase in Triglycerides. No further medications are needed

## 2017-10-22 NOTE — Progress Notes (Signed)
Subjective:    Patient ID: Jerome Garcia, male    DOB: 07-Jun-1923, 84 y.o.   MRN: [id number]  HPI  Patient presents for yearly follow up exam. He is a pleasant 84 year old male who  has a past medical history of Benign prostatic hypertrophy, Cancer ([place]), Carotid artery stenosis, Gastric outlet obstruction (06/2007), HLD (hyperlipidemia), bladder cancer, Hypothyroidism, Pacemaker, and Trifascicular block.  He is here with his caregiver.   He has a history of complete heart block/sp pace maker- is followed by Cardiology   Hyperlipidemia - takes Zocor 20 mg daily  Lab Results  Component Value Date   CHOL 125 09/26/2015   HDL 57.40 09/26/2015   LDLCALC 49 09/26/2015   TRIG 94.0 09/26/2015   CHOLHDL 2 09/26/2015   Hypothyroidism - Takes synthroid 175 mcg daily.   BPH - is controlled with Flomax 0.4 mg   Dementia - Takes Aricept 5 mg daily. He has had episodes of increased confusion at time. His caregiver feels as though his memory is getting worse and is having more trouble getting dressed and is no longer able to bath himself. He now has a 24 hour caregiver at home for him and his wife.   Anxiety - this has been a chronic issue for an unknown amount of time his caregiver reports anxiety to the point of panic attacks   All immunizations and health maintenance protocols were reviewed with the patient and needed orders were placed. He is due for seasonal flu.   Appropriate screening laboratory values were ordered for the patient including screening of hyperlipidemia, renal function and hepatic function.  Medication reconciliation,  past medical history, social history, problem list and allergies were reviewed in detail with the patient  Goals were established with regard to weight loss, exercise, and  diet in compliance with medications  End of life planning was discussed.  He has a DNR at home   Wt Readings from Last 3 Encounters:  10/22/17 142 lb (64.4 kg)  07/23/17 148 lb (67.1  kg)  04/02/17 152 lb (68.9 kg)    Review of Systems  Constitutional: Negative.   HENT: Positive for hearing loss.   Eyes: Negative.   Respiratory: Negative.   Cardiovascular: Negative.   Gastrointestinal: Negative.   Endocrine: Negative.   Genitourinary: Negative.   Musculoskeletal: Negative.   Skin: Negative.   Allergic/Immunologic: Negative.   Neurological: Positive for weakness.  Hematological: Negative.   Psychiatric/Behavioral: Positive for confusion.  All other systems reviewed and are negative.  Past Medical History:  Diagnosis Date  . Benign prostatic hypertrophy   . Cancer [facility])    Bladder Cancer  . Carotid artery stenosis    left carotid bruit  . Gastric outlet obstruction 06/2007   history  . HLD (hyperlipidemia)   . Hx of bladder cancer   . Hypothyroidism   . Pacemaker   . Trifascicular block    history    Social History   Socioeconomic History  . Marital status: Married    Spouse name: Not on file  . Number of children: Not on file  . Years of education: Not on file  . Highest education level: Not on file  Occupational History  . Not on file  Social Needs  . Financial resource strain: Not on file  . Food insecurity:    Worry: Not on file    Inability: Not on file  . Transportation needs:    Medical: Not on file    Non-medical:  Not on file  Tobacco Use  . Smoking status: Former Smoker    Years: 30.00    Types: Cigarettes    Last attempt to quit: 01/01/1978    Years since quitting: 39.8  . Smokeless tobacco: Never Used  Substance and Sexual Activity  . Alcohol use: No  . Drug use: No  . Sexual activity: Not on file  Lifestyle  . Physical activity:    Days per week: Not on file    Minutes per session: Not on file  . Stress: Not on file  Relationships  . Social connections:    Talks on phone: Not on file    Gets together: Not on file    Attends religious service: Not on file    Active member of club or organization: Not on file     Attends meetings of clubs or organizations: Not on file    Relationship status: Not on file  . Intimate partner violence:    Fear of current or ex partner: Not on file    Emotionally abused: Not on file    Physically abused: Not on file    Forced sexual activity: Not on file  Other Topics Concern  . Not on file  Social History Narrative   Art gallery manager - retired     Past Surgical History:  Procedure Laterality Date  . ESOPHAGOGASTRODUODENOSCOPY  07/2008  . GASTROJEJUNOSTOMY  06/2007  . HEMORROIDECTOMY    . ORCHIECTOMY    . PACEMAKER INSERTION  03/2007   permanent transvenous, [employer]    Family History  Problem Relation Age of Onset  . Throat cancer Father        in his 53's  . Cancer Father   . Lung cancer Mother   . Cancer Mother   . Heart attack Sister   . Heart disease Sister        After 57 yrs of age  . Cancer Brother   . Other Brother        history of oral cancer  . Dementia Sister     No Known Allergies  Current Outpatient Medications on File Prior to Visit  Medication Sig Dispense Refill  . aspirin 81 MG tablet Take 81 mg by mouth daily.      Marland Kitchen donepezil (ARICEPT) 5 MG tablet TAKE 1 TABLET AT BEDTIME 90 tablet 4  . levothyroxine (SYNTHROID, LEVOTHROID) 175 MCG tablet TAKE 1 TABLET DAILY BEFORE BREAKFAST 90 tablet 1  . Multiple Vitamins-Minerals (ICAPS PO) Take 2 capsules by mouth daily.     . simvastatin (ZOCOR) 20 MG tablet TAKE 1 TABLET AT BEDTIME 90 tablet 1  . tamsulosin (FLOMAX) 0.4 MG CAPS capsule TAKE 1 CAPSULE DAILY 90 capsule 2  . vitamin E 400 UNIT capsule Take 400 Units by mouth daily.      . Calcium Citrate-Vitamin D (CITRACAL + D PO) Take 2 tablets by mouth daily.      No current facility-administered medications on file prior to visit.     BP 120/78   Temp (!) 97.5 F (36.4 C) (Oral)   Wt 142 lb (64.4 kg)   BMI 19.26 kg/m      Objective:   Physical Exam  Constitutional: He is oriented to person, place, and time. He  appears well-developed and well-nourished. No distress.  HENT:  Head: Normocephalic and atraumatic.  Right Ear: Tympanic membrane, external ear and ear canal normal. Decreased hearing is noted.  Left Ear: External ear and ear canal normal. Decreased  hearing is noted.  Nose: Nose normal.  Mouth/Throat: Oropharynx is clear and moist. No oropharyngeal exudate.  Has hearing aids. + HOH   Eyes: Pupils are equal, round, and reactive to light. Conjunctivae and EOM are normal. Right eye exhibits no discharge. Left eye exhibits no discharge. No scleral icterus.  Neck: Normal range of motion. Neck supple. No JVD present. No tracheal tenderness present. No tracheal deviation present. No thyroid mass and no thyromegaly present.  Cardiovascular: Normal rate, regular rhythm, normal heart sounds and intact distal pulses. Exam reveals no gallop and no friction rub.  No murmur heard. Pulmonary/Chest: Effort normal and breath sounds normal. No stridor. No respiratory distress. He has no wheezes. He has no rales. He exhibits no tenderness.  Abdominal: Soft. Bowel sounds are normal. He exhibits no distension and no mass. There is no tenderness. There is no rebound and no guarding. No hernia.  Musculoskeletal: Normal range of motion. He exhibits no edema, tenderness or deformity.  Has rolling walker. Walks with a slow steady gait    Lymphadenopathy:    He has no cervical adenopathy.  Neurological: He is alert and oriented to person, place, and time. He displays normal reflexes. No cranial nerve deficit or sensory deficit. He exhibits normal muscle tone. Coordination normal.  Skin: Skin is warm and dry. Capillary refill takes less than 2 seconds. No rash noted. He is not diaphoretic. No erythema. No pallor.  Psychiatric: He has a normal mood and affect. His speech is normal and behavior is normal. Judgment and thought content normal. Cognition and memory are impaired. He exhibits abnormal recent memory and abnormal  remote memory.  Nursing note and vitals reviewed.     Assessment & Plan:  1. Acquired hypothyroidism - Basic metabolic panel - CBC with Differential/Platelet - Hepatic function panel - Lipid panel - TSH - levothyroxine (SYNTHROID, LEVOTHROID) 175 MCG tablet; Take 1 tablet (175 mcg total) by mouth daily before breakfast.  Dispense: 90 tablet; Refill: 3  2. Dyslipidemia - Continue with Statin  - Basic metabolic panel - CBC with Differential/Platelet - Hepatic function panel - Lipid panel - TSH - simvastatin (ZOCOR) 20 MG tablet; Take 1 tablet (20 mg total) by mouth at bedtime.  Dispense: 90 tablet; Refill: 3  3. Memory impairment - Will increase Aricept to 10 mg  - Basic metabolic panel - CBC with Differential/Platelet - Hepatic function panel - Lipid panel - TSH  4. Need for influenza vaccination  - Flu vaccine HIGH DOSE PF (Fluzone High dose)  5. Anxiety - Will start on Celexa 10 mg.  - reviewed side effects with care giver  - citalopram (CELEXA) 10 MG tablet; Take 1 tablet (10 mg total) by mouth daily.  Dispense: 90 tablet; Refill: 1  Dorothyann Peng, NP

## 2017-10-22 NOTE — Telephone Encounter (Signed)
Copied from [place] [id number]. Topic: Quick Communication - See Telephone Encounter >> Oct 22, 2017  4:03 PM Bea Graff, NT wrote: CRM for notification. See Telephone encounter for: 10/22/17. Beth, pts caregiver calling to verify if pt should take 2 of the donepezil (ARICEPT) 5 MG tablet until the 10mg  aricept can be delivered to the pt? Please advise. CB#: [phone]

## 2017-10-23 NOTE — Telephone Encounter (Signed)
Spoke to [place] and informed her it is ok to take 2 of the Aricept until the 10 mg comes in.  Also informed her of the lab results.  Nothing further needed at this time.

## 2017-10-29 ENCOUNTER — Telehealth: Payer: Self-pay | Admitting: Adult Health

## 2017-10-29 NOTE — Telephone Encounter (Signed)
[name] notified to stop Celexa.  No further action required.

## 2017-10-29 NOTE — Telephone Encounter (Signed)
Copied from [place] [id number]. Topic: Quick Communication - See Telephone Encounter >> Oct 29, 2017  3:10 PM Rutherford Nail, [place] wrote: CRM for notification. See Telephone encounter for: 10/29/17. [name], patient's caregiver, calling and states that the patient started on citalopram (CELEXA) 10 MG tablet on Saturday (1026/19). On Sunday(10/27/17), states that her was rather sedate. On Monday(10/28/17), patient started becoming agitated and argumentative, which is not his normal behavior. States that he was not eating well yesterday(10/28/17) and is refusing to eat today and is very agitated. States that he is usually a very calm and quiet person, so this is not like him at all. CB#: [phone]

## 2017-10-29 NOTE — Telephone Encounter (Signed)
Have him stop celexa

## 2017-11-18 ENCOUNTER — Ambulatory Visit: Payer: Self-pay

## 2017-11-18 NOTE — Telephone Encounter (Signed)
Returned call to patient care giver. Left VM to call office

## 2017-11-19 ENCOUNTER — Ambulatory Visit: Payer: Self-pay

## 2017-11-19 NOTE — Telephone Encounter (Signed)
This message does not make any sense. It does not contain any useful information or any medication information.   PLEASE CLARIFY.  Thank you!

## 2017-11-19 NOTE — Telephone Encounter (Addendum)
Telephone call from Tereasa Coop, RN supervisor of care givers States that Patient has become anxious, and gets up to go outside. Sallee Provencal had previously d/c Celxa.  Family has had to put an alarm  System in.  States that he needs something too help him relax.  Wishes something to be called in. ..... I originally typed more. Computer malfunction. Sorry    Answer Assessment - Initial Assessment Questions 1. REASON FOR CALL or QUESTION: "What is your reason for calling today?" or "How can I best help you?" or "What question do you have that I can help answer?"     Tereasa Coop  Caregiver supervisor calling in regards to Patients care. States that Patient was last  seen in the office 10/22/17.  At that time Dorothyann Peng  discontinued  The medication  Protocols used: INFORMATION ONLY CALL-A-AH

## 2017-11-20 ENCOUNTER — Telehealth: Payer: Self-pay | Admitting: Adult Health

## 2017-11-20 NOTE — Telephone Encounter (Signed)
Telephone call from Tereasa Coop, Rn care giver supervisor. Requesting that something be called in to help calm Patient down.Patient is experiencing wosre symptoms with Aricept 5mg .  States care giver.  Patient is demonstrating sundowners behavior.

## 2017-11-20 NOTE — Telephone Encounter (Signed)
Per [place] Flow message was not correcting and she was not able to forward to office. New TE has been created.

## 2017-11-21 NOTE — Telephone Encounter (Signed)
Spoke to [name] and informed her that Jerome Garcia is out of town.  Will return on Tuesday.  Offered an appt with another provider.  She declined saying the pt has calmed down some and is more manageable.  He continues to exhibit sundowners behavior.  Would like medication sent to the pharmacy if possible.  Will forward to [facility].

## 2017-11-25 NOTE — Telephone Encounter (Signed)
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

## 2017-11-25 NOTE — Telephone Encounter (Addendum)
Spoke to [employer]. Advised of below message from [place].  [name] agreed to implement melatonin 5 mg in the evening.  Advised that it may take some time to become effective and instructed her to call back in 2 weeks.  Sooner if needed/pt worsens.

## 2017-11-26 ENCOUNTER — Ambulatory Visit: Payer: Medicare Other | Admitting: Family

## 2017-11-26 ENCOUNTER — Inpatient Hospital Stay (HOSPITAL_COMMUNITY): Admission: RE | Admit: 2017-11-26 | Payer: Medicare Other | Source: Ambulatory Visit

## 2017-12-09 NOTE — Telephone Encounter (Signed)
Tereasa Coop, pts caregiver called in and stated that pt is not sleeping, maybe 2 hours at a time and then he is up and roaming.  She feels like the meds are not working and maybe need to adjust them.  They have been giving him meliton and it has made no difference at all.  She stated that he is being very aggressive and yelling at everyone.  She would like to know what [facility]  [name] [phone]

## 2017-12-10 ENCOUNTER — Other Ambulatory Visit: Payer: Self-pay | Admitting: Adult Health

## 2017-12-10 MED ORDER — TRAZODONE HCL 50 MG PO TABS: 25.0000 mg | ORAL_TABLET | Freq: Every evening | ORAL | 3 refills | Status: AC | PRN

## 2017-12-10 NOTE — Telephone Encounter (Signed)
Ok to decrease Aricept to 5 mg

## 2017-12-10 NOTE — Telephone Encounter (Signed)
[name] notified to decrease the Aricept to 5 mg.  Nothing further needed at this time.

## 2017-12-10 NOTE — Telephone Encounter (Signed)
I have sent in Trazodone, start with 1/2 tab and if needed can take full tab before bed.

## 2017-12-10 NOTE — Telephone Encounter (Signed)
Spoke to [name] and informed her that trazodone has been sent to the pharmacy. Advised to start with 1/2 tablet.  She stated she thinks the increase in Aricept has made the pt agitated.  Would like to stop the medication or go back to lower dose.  Please advise.

## 2018-01-09 ENCOUNTER — Other Ambulatory Visit: Payer: Self-pay | Admitting: Adult Health

## 2018-01-09 NOTE — Telephone Encounter (Signed)
Denied.  Filled on 12/10/2017 with refills.

## 2018-02-19 ENCOUNTER — Other Ambulatory Visit: Payer: Self-pay

## 2018-02-19 ENCOUNTER — Emergency Department (HOSPITAL_COMMUNITY): Payer: Medicare Other

## 2018-02-19 ENCOUNTER — Emergency Department (HOSPITAL_COMMUNITY)
Admission: EM | Admit: 2018-02-19 | Discharge: 2018-02-19 | Disposition: A | Discharge status: Home / Self Care | Payer: Medicare Other | Attending: Emergency Medicine | Admitting: Emergency Medicine

## 2018-02-19 DIAGNOSIS — F0390 Unspecified dementia without behavioral disturbance: Secondary | ICD-10-CM | POA: Diagnosis not present

## 2018-02-19 DIAGNOSIS — E039 Hypothyroidism, unspecified: Secondary | ICD-10-CM | POA: Insufficient documentation

## 2018-02-19 DIAGNOSIS — R404 Transient alteration of awareness: Secondary | ICD-10-CM | POA: Diagnosis not present

## 2018-02-19 DIAGNOSIS — Z87891 Personal history of nicotine dependence: Secondary | ICD-10-CM | POA: Diagnosis not present

## 2018-02-19 DIAGNOSIS — R4182 Altered mental status, unspecified: Secondary | ICD-10-CM | POA: Diagnosis present

## 2018-02-19 DIAGNOSIS — Z79899 Other long term (current) drug therapy: Secondary | ICD-10-CM | POA: Diagnosis not present

## 2018-02-19 DIAGNOSIS — Z950 Presence of cardiac pacemaker: Secondary | ICD-10-CM | POA: Insufficient documentation

## 2018-02-19 DIAGNOSIS — Z8551 Personal history of malignant neoplasm of bladder: Secondary | ICD-10-CM | POA: Insufficient documentation

## 2018-02-19 DIAGNOSIS — Z7982 Long term (current) use of aspirin: Secondary | ICD-10-CM | POA: Diagnosis not present

## 2018-02-19 LAB — COMPREHENSIVE METABOLIC PANEL
ALT: 8 U/L (ref 0–44)
AST: 21 U/L (ref 15–41)
Albumin: 2.9 g/dL — ABNORMAL LOW (ref 3.5–5.0)
Alkaline Phosphatase: 47 U/L (ref 38–126)
Anion gap: 6 (ref 5–15)
BUN: 27 mg/dL — ABNORMAL HIGH (ref 8–23)
CHLORIDE: 109 mmol/L (ref 98–111)
CO2: 25 mmol/L (ref 22–32)
Calcium: 8.5 mg/dL — ABNORMAL LOW (ref 8.9–10.3)
Creatinine, Ser: 1.4 mg/dL — ABNORMAL HIGH (ref 0.61–1.24)
GFR calc Af Amer: 50 mL/min — ABNORMAL LOW (ref >60)
GFR calc non Af Amer: 43 mL/min — ABNORMAL LOW (ref >60)
Glucose, Bld: 106 mg/dL — ABNORMAL HIGH (ref 70–99)
POTASSIUM: 5.6 mmol/L — AB (ref 3.5–5.1)
Sodium: 140 mmol/L (ref 135–145)
Total Bilirubin: 0.6 mg/dL (ref 0.3–1.2)
Total Protein: 5.5 g/dL — ABNORMAL LOW (ref 6.5–8.1)

## 2018-02-19 LAB — POCT I-STAT EG7
Acid-Base Excess: 4 mmol/L — ABNORMAL HIGH (ref 0.0–2.0)
Acid-Base Excess: 2 mmol/L (ref 0.0–2.0)
Bicarbonate: 28.2 mmol/L — ABNORMAL HIGH (ref 20.0–28.0)
Bicarbonate: 29 mmol/L — ABNORMAL HIGH (ref 20.0–28.0)
CALCIUM ION: 1.05 mmol/L — AB (ref 1.15–1.40)
Calcium, Ion: 1.21 mmol/L (ref 1.15–1.40)
HCT: 36 % — ABNORMAL LOW (ref 39.0–52.0)
HCT: 35 % — ABNORMAL LOW (ref 39.0–52.0)
Hemoglobin: 12.2 g/dL — ABNORMAL LOW (ref 13.0–17.0)
Hemoglobin: 11.9 g/dL — ABNORMAL LOW (ref 13.0–17.0)
O2 Saturation: 87 %
O2 Saturation: 24 %
Potassium: 5.5 mmol/L — ABNORMAL HIGH (ref 3.5–5.1)
Potassium: 4.7 mmol/L (ref 3.5–5.1)
Sodium: 139 mmol/L (ref 135–145)
Sodium: 141 mmol/L (ref 135–145)
TCO2: 29 mmol/L (ref 22–32)
TCO2: 31 mmol/L (ref 22–32)
pCO2, Ven: 41.6 mmHg — ABNORMAL LOW (ref 44.0–60.0)
pCO2, Ven: 54.3 mmHg (ref 44.0–60.0)
pH, Ven: 7.439 — ABNORMAL HIGH (ref 7.250–7.430)
pH, Ven: 7.336 (ref 7.250–7.430)
pO2, Ven: 50 mmHg — ABNORMAL HIGH (ref 32.0–45.0)
pO2, Ven: 19 mmHg — CL (ref 32.0–45.0)

## 2018-02-19 LAB — CBC WITH DIFFERENTIAL/PLATELET
Abs Immature Granulocytes: 0.01 10*3/uL (ref 0.00–0.07)
BASOS ABS: 0 10*3/uL (ref 0.0–0.1)
Basophils Relative: 1 %
Eosinophils Absolute: 0.2 10*3/uL (ref 0.0–0.5)
Eosinophils Relative: 2 %
HCT: 39.1 % (ref 39.0–52.0)
Hemoglobin: 12.2 g/dL — ABNORMAL LOW (ref 13.0–17.0)
Immature Granulocytes: 0 %
Lymphocytes Relative: 19 %
Lymphs Abs: 1.5 10*3/uL (ref 0.7–4.0)
MCH: 29.7 pg (ref 26.0–34.0)
MCHC: 31.2 g/dL (ref 30.0–36.0)
MCV: 95.1 fL (ref 80.0–100.0)
Monocytes Absolute: 0.7 10*3/uL (ref 0.1–1.0)
Monocytes Relative: 9 %
Neutro Abs: 5.5 10*3/uL (ref 1.7–7.7)
Neutrophils Relative %: 69 %
Platelets: 169 10*3/uL (ref 150–400)
RBC: 4.11 MIL/uL — AB (ref 4.22–5.81)
RDW: 14.2 % (ref 11.5–15.5)
WBC: 7.9 10*3/uL (ref 4.0–10.5)
nRBC: 0 % (ref 0.0–0.2)

## 2018-02-19 LAB — CBG MONITORING, ED: Glucose-Capillary: 97 mg/dL (ref 70–99)

## 2018-02-19 LAB — URINALYSIS, ROUTINE W REFLEX MICROSCOPIC
Bilirubin Urine: NEGATIVE
Glucose, UA: NEGATIVE mg/dL
Hgb urine dipstick: NEGATIVE
Ketones, ur: NEGATIVE mg/dL
Leukocytes,Ua: NEGATIVE
Nitrite: NEGATIVE
Protein, ur: NEGATIVE mg/dL
Specific Gravity, Urine: 1.004 — ABNORMAL LOW (ref 1.005–1.030)
pH: 6 (ref 5.0–8.0)

## 2018-02-19 LAB — I-STAT TROPONIN, ED: Troponin i, poc: 0.01 ng/mL (ref 0.00–0.08)

## 2018-02-19 NOTE — ED Notes (Signed)
Pt unable to sign discharge, no WOW with e-sign available. Pt's daughter verbalizes consent to discharge, reviewed discharge papers with her, and allowed time time for questions, answers were provided. Pt left in stable condition with [facility].

## 2018-02-19 NOTE — ED Notes (Signed)
Pt's son, Ruffus Kamaka would like to be called at [phone] when [facility] picks the pt up.

## 2018-02-19 NOTE — ED Notes (Signed)
Pt in CT.

## 2018-02-19 NOTE — Discharge Instructions (Addendum)
Talk with primary care doctor about hospice at home.  Ensure that DNR is with patient at all times.

## 2018-02-19 NOTE — ED Notes (Signed)
[facility] called @ 1818-per Myriam Jacobson, RN-called by Levada Dy
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
## 2018-02-19 NOTE — ED Notes (Signed)
Attempted to interrogate pacemaker, [employer] interrogator states no device found. Per pt's daughter they no longer want anything to be done with pacemaker, last check in 2018.

## 2018-02-19 NOTE — ED Notes (Signed)
Attempted to wake pt up to see if able to transfer to wheelchair. Pt unable to wake up long enough to safely transfer. Secretary to call [facility] for transport home.

## 2018-02-19 NOTE — ED Provider Notes (Signed)
[facility] Provider Note   CSN: [id number] Arrival date & time: 02/19/18  1600    History   Chief Complaint Chief Complaint  Patient presents with  . Respiratory Distress    HPI Jerome Garcia is a 85 y.o. male.     Level 5 caveat due to dementia.  History is obtained from EMS and family.  The history is provided by the EMS personnel, a caregiver and a relative.  Altered Mental Status  Presenting symptoms: lethargy   Severity:  Moderate Most recent episode:  Today Episode history:  Single Timing:  Constant Progression:  Unchanged Chronicity:  Recurrent Context: dementia and not taking medications as prescribed (possible took one dose of depakote, klonopin by accident (prescribed to wife))     Past Medical History:  Diagnosis Date  . Benign prostatic hypertrophy   . Cancer [facility])    Bladder Cancer  . Carotid artery stenosis    left carotid bruit  . Gastric outlet obstruction 06/2007   history  . HLD (hyperlipidemia)   . Hx of bladder cancer   . Hypothyroidism   . Pacemaker   . Trifascicular block    history    Patient Active Problem List   Diagnosis Date Noted  . Memory impairment 12/01/2014  . Chronotropic incompetence with sinus node dysfunction (HCC) 10/17/2011  . Abdominal pulsatile mass 10/17/2011  . 1st degree AV block 10/17/2011  . [employer] 10/12/2010  . Atrial fibrillation ([place]) 10/12/2010  . DIZZINESS 03/11/2008  . CAROTID ARTERY DISEASE 02/27/2008  . MALNUTRITION 08/05/2007  . SUPERFICIAL VEIN THROMBOSIS 07/29/2006  . BLADDER CANCER 07/01/2006  . Hypothyroidism 07/01/2006  . Dyslipidemia 07/01/2006  . BLOCK, TRIFASCICULAR 07/01/2006  . BPH (benign prostatic hyperplasia) 07/01/2006    Past Surgical History:  Procedure Laterality Date  . ESOPHAGOGASTRODUODENOSCOPY  07/2008  . GASTROJEJUNOSTOMY  06/2007  . HEMORROIDECTOMY    . ORCHIECTOMY    . PACEMAKER INSERTION  03/2007   permanent transvenous, [employer]        Home Medications    Prior to Admission medications   Medication Sig Start Date End Date Taking? Authorizing Provider  aspirin 81 MG tablet Take 81 mg by mouth daily.      [provider]  Calcium Citrate-Vitamin D (CITRACAL + D PO) Take 2 tablets by mouth daily.     [provider]  citalopram (CELEXA) 10 MG tablet Take 1 tablet (10 mg total) by mouth daily. 10/22/17   Nafziger, Tommi Rumps, NP  donepezil (ARICEPT) 10 MG tablet Take 1 tablet (10 mg total) by mouth at bedtime. Patient taking differently: Take 5 mg by mouth at bedtime.  10/22/17   Nafziger, Tommi Rumps, NP  levothyroxine (SYNTHROID, LEVOTHROID) 175 MCG tablet Take 1 tablet (175 mcg total) by mouth daily before breakfast. 10/22/17   Nafziger, Tommi Rumps, NP  Melatonin 5 MG TABS Take 1 tablet by mouth every evening.    [provider]  Multiple Vitamins-Minerals (ICAPS PO) Take 2 capsules by mouth daily.     [provider]  simvastatin (ZOCOR) 20 MG tablet Take 1 tablet (20 mg total) by mouth at bedtime. 10/22/17   Nafziger, Tommi Rumps, NP  tamsulosin (FLOMAX) 0.4 MG CAPS capsule TAKE 1 CAPSULE DAILY 07/09/17   Marletta Lor, MD  traZODone (DESYREL) 50 MG tablet Take 0.5-1 tablets (25-50 mg total) by mouth at bedtime as needed for sleep. 12/10/17   Dorothyann Peng, NP  vitamin E 400 UNIT capsule Take 400 Units by  mouth daily.      [provider]    Family History Family History  Problem Relation Age of Onset  . Throat cancer Father        in his 69's  . Cancer Father   . Lung cancer Mother   . Cancer Mother   . Heart attack Sister   . Heart disease Sister        After 84 yrs of age  . Cancer Brother   . Other Brother        history of oral cancer  . Dementia Sister     Social History Social History   Tobacco Use  . Smoking status: Former Smoker    Years: 30.00    Types: Cigarettes    Last attempt to quit: 01/01/1978    Years since  quitting: 40.1  . Smokeless tobacco: Never Used  Substance Use Topics  . Alcohol use: No  . Drug use: No     Allergies   Patient has no known allergies.   Review of Systems Review of Systems  Unable to perform ROS: Dementia     Physical Exam Updated Vital Signs  ED Triage Vitals [02/19/18 1602]  Enc Vitals Group     BP (!) 157/87     Pulse Rate (!) 51     Resp 12     Temp 97.9 F (36.6 C)     Temp Source Oral     SpO2 100 %     Weight      Height      Head Circumference      Peak Flow      Pain Score      Pain Loc      Pain Edu?      Excl. in [place]?     Physical Exam Vitals signs and nursing note reviewed.  Constitutional:      General: He is not in acute distress.    Appearance: He is well-developed. He is not ill-appearing.  HENT:     Head: Normocephalic and atraumatic.     Nose: Nose normal.     Mouth/Throat:     Mouth: Mucous membranes are moist.  Eyes:     Extraocular Movements: Extraocular movements intact.     Conjunctiva/sclera: Conjunctivae normal.     Pupils: Pupils are equal, round, and reactive to light.  Neck:     Musculoskeletal: Neck supple.  Cardiovascular:     Rate and Rhythm: Normal rate and regular rhythm.     Pulses: Normal pulses.     Heart sounds: No murmur.  Pulmonary:     Effort: Pulmonary effort is normal. No respiratory distress.     Breath sounds: Normal breath sounds.  Abdominal:     General: There is no distension.     Palpations: Abdomen is soft.     Tenderness: There is no abdominal tenderness.  Musculoskeletal: Normal range of motion.     Right lower leg: No edema.     Left lower leg: No edema.  Skin:    General: Skin is warm and dry.  Neurological:     Mental Status: He is alert.     GCS: GCS eye subscore is 4. GCS verbal subscore is 4. GCS motor subscore is 5.      ED Treatments / Results  Labs (all labs ordered are listed, but only abnormal results are displayed) Labs Reviewed  CBC WITH  DIFFERENTIAL/PLATELET - Abnormal; Notable for the following components:  Result Value   RBC 4.11 (*)    Hemoglobin 12.2 (*)    All other components within normal limits  COMPREHENSIVE METABOLIC PANEL - Abnormal; Notable for the following components:   Potassium 5.6 (*)    Glucose, Bld 106 (*)    BUN 27 (*)    Creatinine, Ser 1.40 (*)    Calcium 8.5 (*)    Total Protein 5.5 (*)    Albumin 2.9 (*)    GFR calc non Af Amer 43 (*)    GFR calc Af Amer 50 (*)    All other components within normal limits  URINALYSIS, ROUTINE W REFLEX MICROSCOPIC - Abnormal; Notable for the following components:   Color, Urine STRAW (*)    Specific Gravity, Urine 1.004 (*)    All other components within normal limits  POCT I-STAT EG7 - Abnormal; Notable for the following components:   pH, Ven 7.439 (*)    pCO2, Ven 41.6 (*)    pO2, Ven 50.0 (*)    Bicarbonate 28.2 (*)    Acid-Base Excess 4.0 (*)    Potassium 5.5 (*)    Calcium, Ion 1.05 (*)    HCT 36.0 (*)    Hemoglobin 12.2 (*)    All other components within normal limits  POCT I-STAT EG7 - Abnormal; Notable for the following components:   pO2, Ven 19.0 (*)    Bicarbonate 29.0 (*)    HCT 35.0 (*)    Hemoglobin 11.9 (*)    All other components within normal limits  URINE CULTURE  BLOOD GAS, VENOUS  CBG MONITORING, ED  I-STAT TROPONIN, ED    EKG EKG Interpretation  Date/Time:  Wednesday February 19 2018 16:03:47 EST Ventricular Rate:  50 PR Interval:    QRS Duration: 159 QT Interval:  458 QTC Calculation: 414 R Axis:   -72 Text Interpretation:  Junctional rhythm RBBB and LAFB Technically poor tracing Confirmed by Lennice Sites [phone]) on 02/19/2018 4:09:53 PM   Radiology Ct Head Wo Contrast  Result Date: 02/19/2018 CLINICAL DATA:  85 y/o  M; unresponsive, respiratory arrest. EXAM: CT HEAD WITHOUT CONTRAST TECHNIQUE: Contiguous axial images were obtained from the base of the skull through the vertex without intravenous contrast.  COMPARISON:  None. FINDINGS: Brain: No evidence of acute infarction, hemorrhage, hydrocephalus, extra-axial collection or mass lesion/mass effect. Small chronic infarct in the right occipital lobe. Moderate chronic microvascular ischemic changes and volume loss of the brain. Vascular: Calcific atherosclerosis of carotid siphons and vertebral arteries. Orbits are unremarkable. Skull: Normal. Negative for fracture or focal lesion. Sinuses/Orbits: No acute finding. Other: None. IMPRESSION: 1. No acute intracranial abnormality identified. 2. Moderate chronic microvascular ischemic changes and volume loss of the brain. Small chronic infarct in the right occipital lobe. Electronically Signed   By: Kristine Garbe M.D.   On: 02/19/2018 16:32   Dg Chest Portable 1 View  Result Date: 02/19/2018 CLINICAL DATA:  Altered mentation today EXAM: PORTABLE CHEST 1 VIEW COMPARISON:  03/01/2017 FINDINGS: Stable heart size and mediastinal contours with left-sided pacemaker apparatus and leads in the right atrium right ventricle. The patient is slightly rotated. Mild aortic atherosclerosis is noted without aneurysm. The lungs are clear with minimal atelectasis at the left base. Trace effusion is suggested with blunting the left lateral costophrenic angle. IMPRESSION: Left basilar atelectasis and trace left effusion. Electronically Signed   By: Ashley Royalty M.D.   On: 02/19/2018 16:49    Procedures Procedures (including critical care time)  Medications Ordered in ED  Medications - No data to display   Initial Impression / Assessment and Plan / ED Course  I have reviewed the triage vital signs and the nursing notes.  Pertinent labs & imaging results that were available during my care of the patient were reviewed by me and considered in my medical decision making (see chart for details).        OVIDE DUSEK is a 85 year old male with history of bladder cancer, hypothyroidism who presents to the ED after  possible ingestion change in his mental status.  Patient with normal vitals except for bradycardia.  No fever.  Patient with GCS of 13 upon arrival.  He appears to intermittently follow commands.  Moves all of his extremities.  Patient was found by home aide somewhat unresponsive.  Patient has a history of dementia.  Patient's wife who also has dementia states that she he may have taken her Depakote and clonidine dose by accident.  EMS states patient never lost pulses.  They gave Narcan.  Patient had some bag breaths but upon arrival he is breathing on his own.  Family eventually arrived and states that he is DNR.  He already had labs and imaging done prior to their arrival.  There is no need for airway protection upon my initial evaluation.  CT of the head showed no acute findings.  Chest x-ray showed no pneumonia, no pneumothorax, no pleural effusion.  Urinalysis negative for infection.  No significant electrolyte abnormality, kidney injury, leukocytosis, anemia.  Blood gas is reassuring.  Family states that patient overall is at his baseline mental status.  Some days he is more alert and interactive and some days he is more somnolent.  They state that he did not sleep well yesterday.  They feel comfortable with discharge back to home where they have 24/7 aide.  They will discuss with primary care doctor about getting hospice involved which they have been thinking about doing as they state that his wife has hospice at home.  Patient overall likely at his baseline and was discharged back to home as he is DNR and family states they are okay with him going back to his home.   This chart was dictated using voice recognition software.  Despite best efforts to proofread,  errors can occur which can change the documentation meaning.    Final Clinical Impressions(s) / ED Diagnoses   Final diagnoses:  Transient alteration of awareness    ED Discharge Orders    None       Lennice Sites, DO 02/19/18  1750

## 2018-02-20 ENCOUNTER — Telehealth: Payer: Self-pay | Admitting: Adult Health

## 2018-02-20 LAB — URINE CULTURE: Culture: NO GROWTH

## 2018-02-20 NOTE — Telephone Encounter (Signed)
Copied from [place] [phone]. Topic: Quick Communication - Home Health Verbal Orders >> Feb 20, 2018 10:43 AM Windy Kalata wrote: Caller/Agency: [name] patients care coordinator Callback Number: [phone] Requesting OT/PT/Skilled Nursing/Social Work: Patient was seen in the ER yesterday and she is needing an order faxed to her for [place], in home hospice  Fax is [phone]

## 2018-02-21 NOTE — Telephone Encounter (Signed)
Left a message for a return call.

## 2018-02-21 NOTE — Telephone Encounter (Signed)
Ok for orders? 

## 2018-02-21 NOTE — Telephone Encounter (Signed)
[name] called back and aware ok for orders.  However, they need the order faxed to hospice (at the number below) in order for pt to be seen and evaluated.

## 2018-02-24 NOTE — Telephone Encounter (Signed)
[name] calling back to check status of fax, she states that hospice has not received it. Please advise.

## 2018-02-24 NOTE — Telephone Encounter (Signed)
[name], RN is calling back to say please fax that order to hospice today. She really does not want him to have to go back to the hospital, he is not having a good day

## 2018-02-24 NOTE — Telephone Encounter (Signed)
Spoke with [name] at [facility] - states that they will accept this telephone encounter stamped where Tommi Rumps agreed to the orders. I have faxed these to Hospice so that the patient can have services available to him today.  Will hold fax at my desk to ensure this has gone through and has been received.   Nothing further needed.

## 2018-02-25 ENCOUNTER — Other Ambulatory Visit: Payer: Self-pay | Admitting: Adult Health

## 2018-02-25 MED ORDER — LORAZEPAM 0.5 MG PO TABS: 0.5000 mg | ORAL_TABLET | Freq: Three times a day (TID) | ORAL | 1 refills | Status: AC | PRN

## 2018-02-25 NOTE — Telephone Encounter (Signed)
Orders faxed again. [name] aware to let me know if these are still not received.

## 2018-02-25 NOTE — Telephone Encounter (Signed)
Ativan 0.5 mg Q8H PRN sent into local pharmacy

## 2018-02-25 NOTE — Telephone Encounter (Signed)
[name] calling.  States that Hospice does have the referral but she would like to know if Tommi Rumps would RX any medication to help calm pt down - trazadone just isn't working, he is wondering around, trying to get outside.  Not orientated to time and place, pt is having hallucinations.  Pt didn't sleep last night or today and caregivers are having a time keeping him safe at this point. Pt would get RX filled at  [id number] - [place], [place] [phone] (Phone) [phone] (Fax)   [name] can be reached at [phone]

## 2018-02-26 NOTE — Telephone Encounter (Signed)
[facility] is aware that medication has been sent in. Pt daughter actually picked it up last night so he has started on this. Pt also is being evaluated really soon by hospice. They did receive all notes that were faxed.  Nothing further needed.

## 2018-04-03 ENCOUNTER — Telehealth: Payer: Self-pay

## 2018-04-03 NOTE — Telephone Encounter (Addendum)
Paper work received today.  Placed in [name]'s folder for signature.

## 2018-04-03 NOTE — Telephone Encounter (Signed)
Have you received this paperwork? Also the Hospice lady is [employer], NOT [employer]. Thanks!  Copied from [place] [phone]. Topic: General - Inquiry >> Apr 01, 2018 12:07 PM Vernona Rieger wrote: Reason for CRM: Ivin Booty with [facility] called to see if the office received the fax she had sent over yesterday for supplemental orders. Her call back is [phone]. Please Advise. >> Apr 03, 2018 11:26 AM Yvette Rack wrote: Nilda Simmer with Hospice requests to speak with [facility] regarding orders that were faxed. Cb# [phone]

## 2018-04-04 NOTE — Telephone Encounter (Signed)
Shavonne calling back- She states that paperwork needs to be signed by Dr. Elease Hashimoto.

## 2018-04-04 NOTE — Telephone Encounter (Signed)
Faxed to [facility] @ [phone].  Received fax confirmation.  Nothing further needed.

## 2018-04-23 ENCOUNTER — Ambulatory Visit: Payer: Medicare Other | Admitting: Adult Health

## 2018-04-30 ENCOUNTER — Telehealth: Payer: Self-pay | Admitting: Adult Health

## 2018-04-30 NOTE — Telephone Encounter (Signed)
Received death certificate to complete. Will complete and send back to funeral home

## 2018-05-02 DEATH — deceased

## 2020-06-18 IMAGING — CT CT HEAD W/O CM
4 series · 17 of 47 positions shown, 19 images · non-contrast
Comparison: None.

CLINICAL DATA: [AGE]/o  M; unresponsive, respiratory arrest.

EXAM:
CT HEAD WITHOUT CONTRAST
TECHNIQUE: Contiguous axial images were obtained from the base of the skull
through the vertex without intravenous contrast.

[Series 3: head without · axial · non-contrast · 0.47mm/px · z∈[-83,+47]mm · 7 of 36 slices shown, 9 images]
[im 5/36  brain]
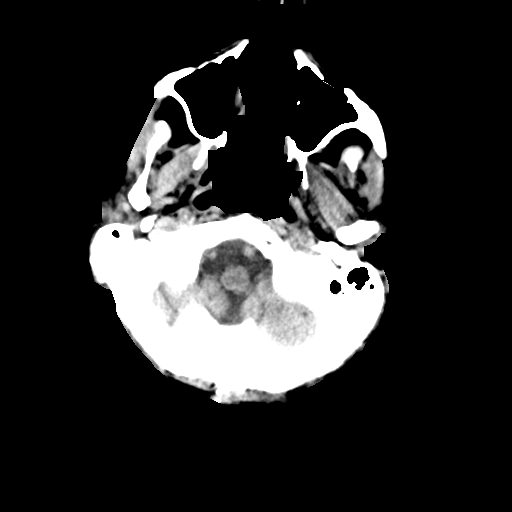
[im 5/36  bone]
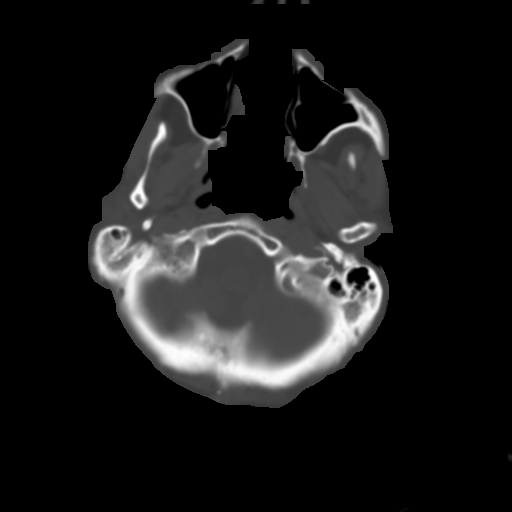
[im 9/36  brain]
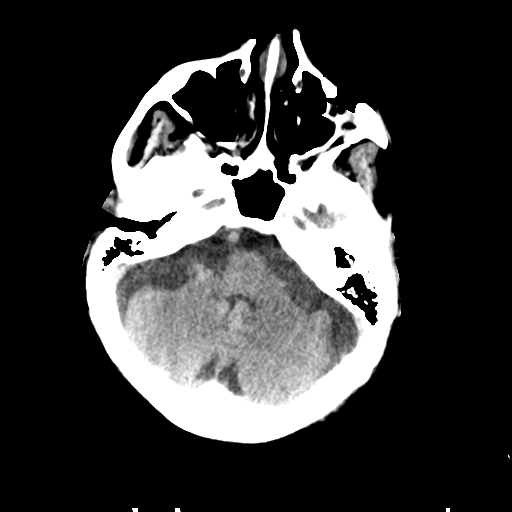
[im 14/36  brain]
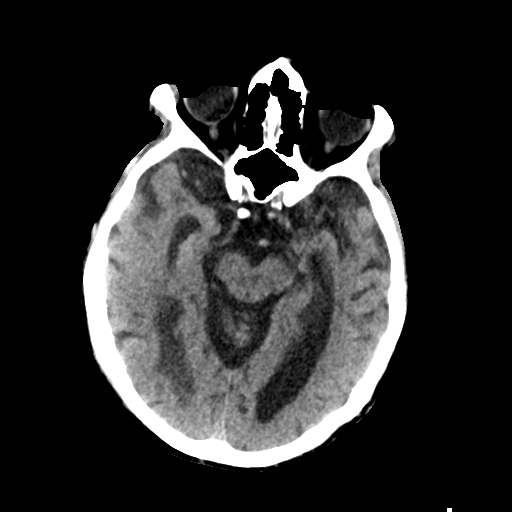
[im 18/36  brain]
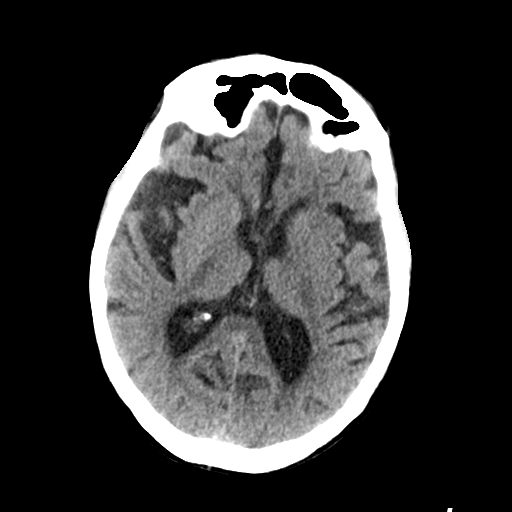
[im 22/36  brain]
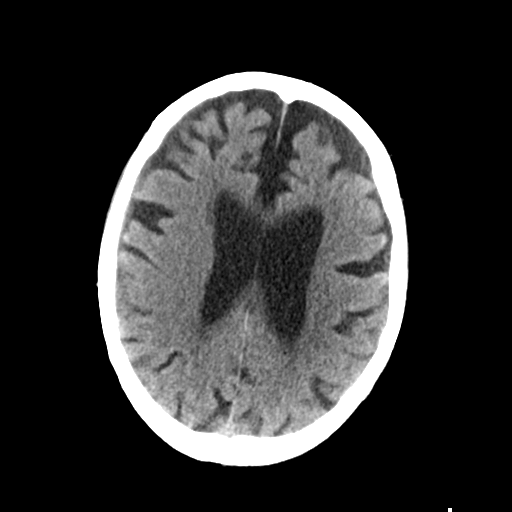
[im 22/36  bone]
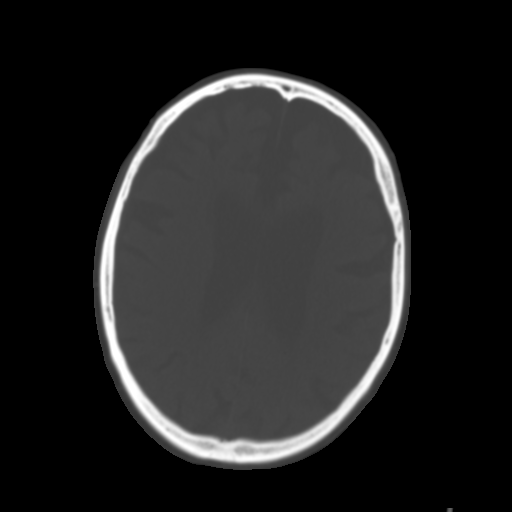
[im 27/36  brain]
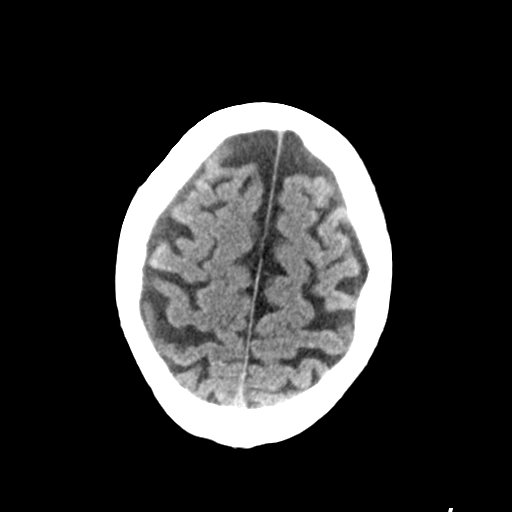
[im 31/36  brain]
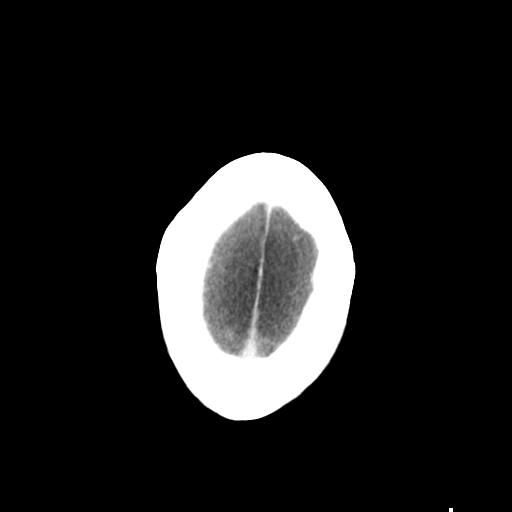

[Series 4: head bone · axial · 0.47mm/px · z∈[-87,-23]mm · 4 of 90 slices shown]
[im 9/90  bone]
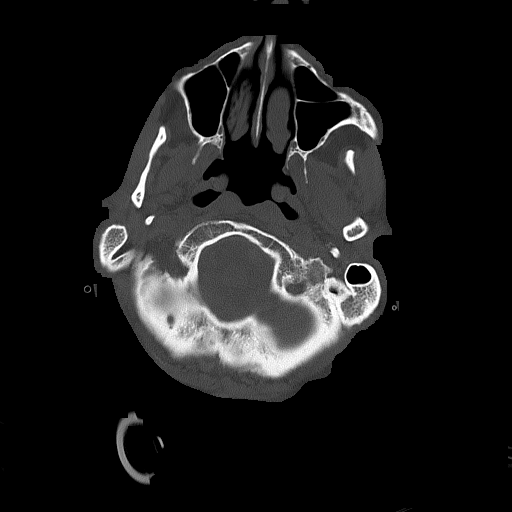
[im 18/90  bone]
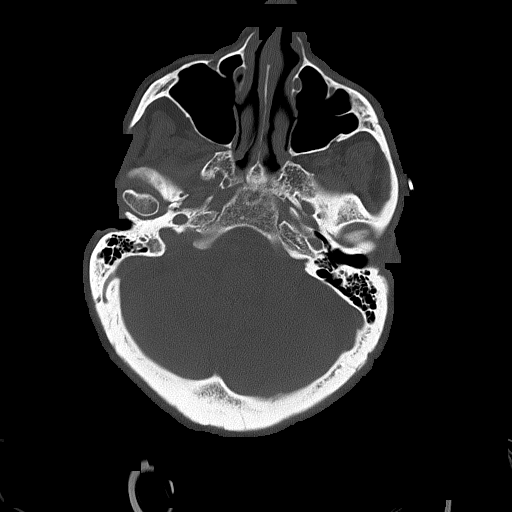
[im 27/90  bone]
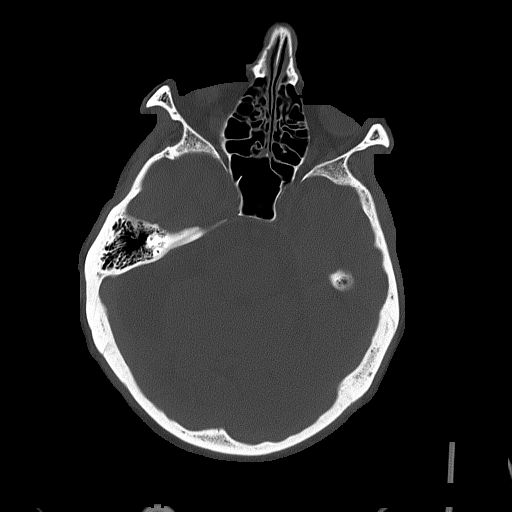
[im 41/90  bone]
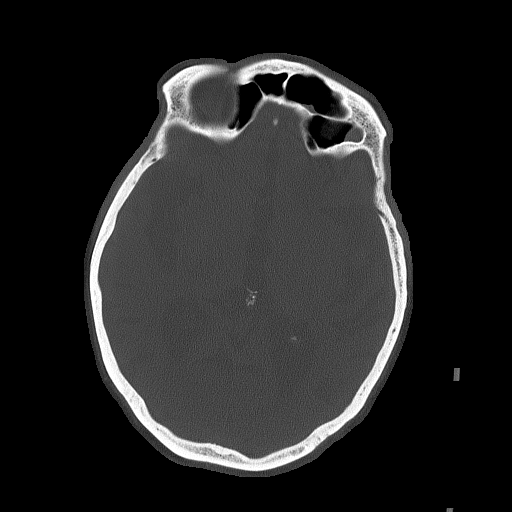

[Series 5: head without cor · coronal · non-contrast · 0.37mm/px · 3 of 73 slices shown]
[im 25/73  brain]
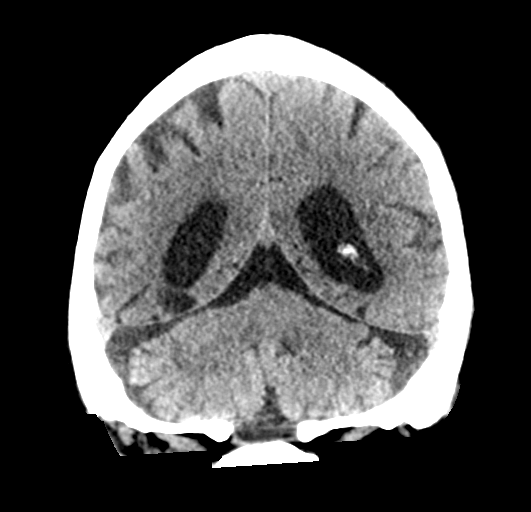
[im 33/73  brain]
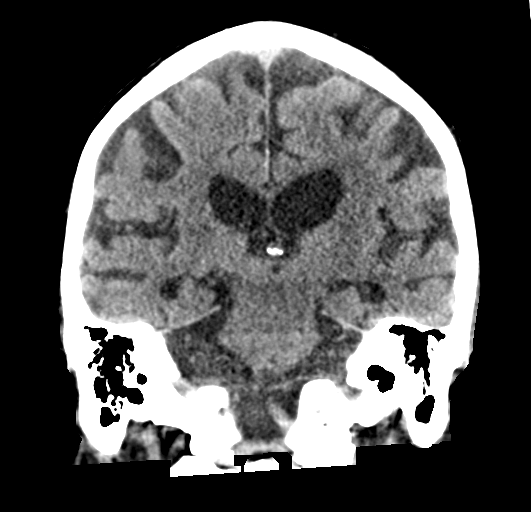
[im 41/73  brain]
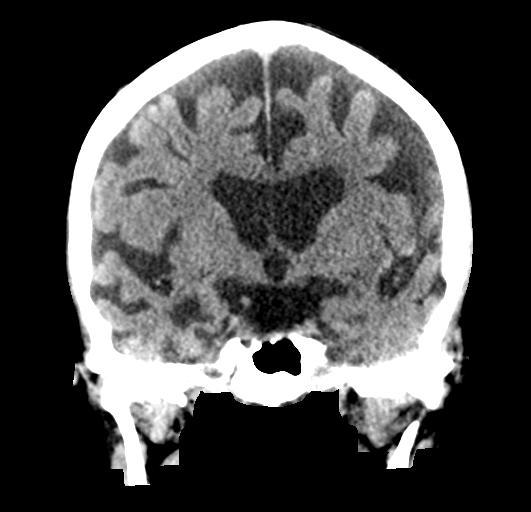

[Series 6: head without sag · sagittal · non-contrast · 0.36mm/px · 3 of 66 slices shown]
[im 22/66  brain]
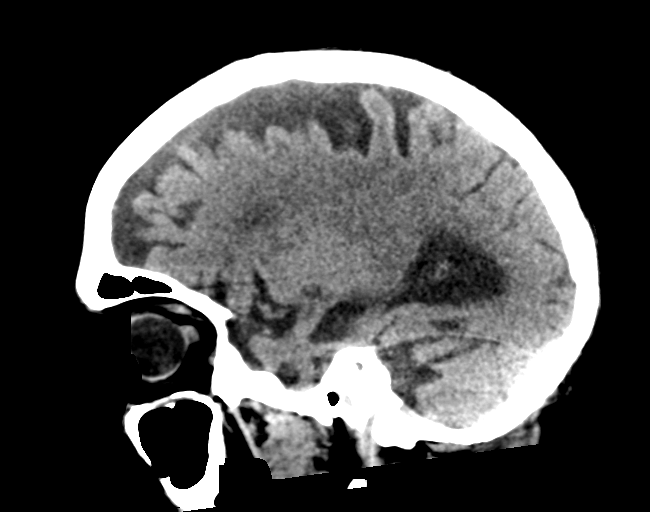
[im 33/66  brain]
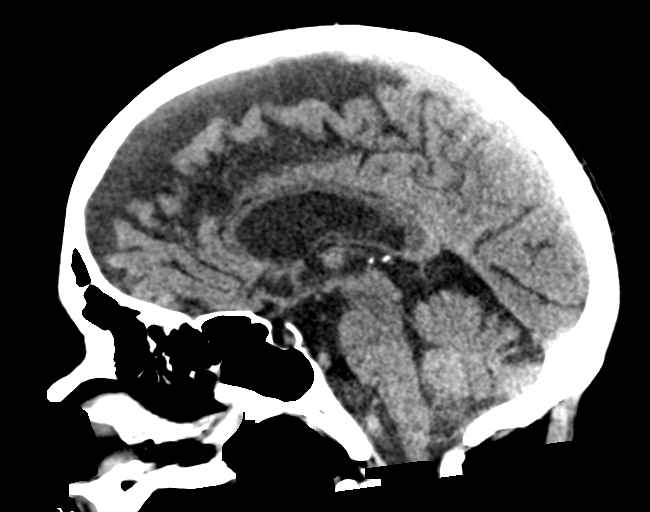
[im 44/66  brain]
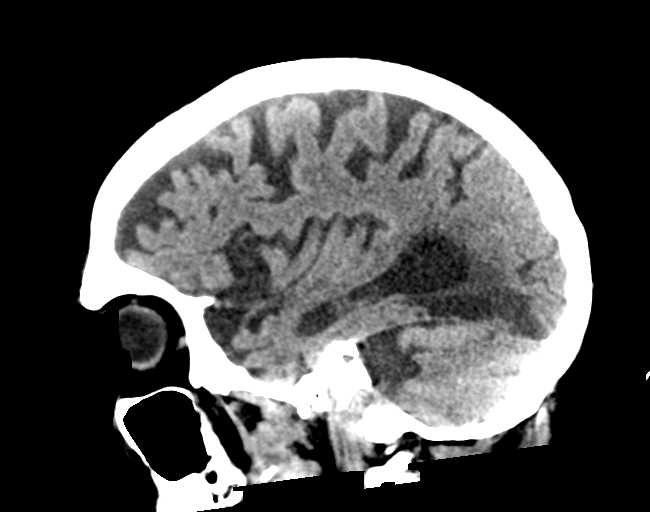

[17 of 47 positions shown; findings below may reference images not displayed]

FINDINGS: Brain: No evidence of acute infarction, hemorrhage, hydrocephalus,
extra-axial collection or mass lesion/mass effect. Small chronic
infarct in the right occipital lobe. Moderate chronic microvascular
ischemic changes and volume loss of the brain.

Vascular: Calcific atherosclerosis of carotid siphons and vertebral
arteries. Orbits are unremarkable.

Skull: Normal. Negative for fracture or focal lesion.

Sinuses/Orbits: No acute finding.

Other: None.
IMPRESSION: 1. No acute intracranial abnormality identified.
2. Moderate chronic microvascular ischemic changes and volume loss
of the brain. Small chronic infarct in the right occipital lobe.
